# Patient Record
Sex: Female | Born: 1995 | Race: White | Hispanic: Yes | Smoking: Light tobacco smoker
Health system: Southern US, Community
[De-identification: ages and names within clinical notes are randomized; demographics above are authoritative.]

## PROBLEM LIST (undated history)

## (undated) DIAGNOSIS — F419 Anxiety disorder, unspecified: Secondary | ICD-10-CM

## (undated) DIAGNOSIS — F3181 Bipolar II disorder: Secondary | ICD-10-CM

## (undated) DIAGNOSIS — F32A Depression, unspecified: Secondary | ICD-10-CM

## (undated) HISTORY — PX: ANKLE ARTHROSCOPY WITH RECONSTRUCTION: SHX5583

---

## 2016-08-16 ENCOUNTER — Emergency Department (HOSPITAL_COMMUNITY)
Admission: EM | Admit: 2016-08-16 | Discharge: 2016-08-17 | Disposition: A | Discharge status: Home / Self Care | Payer: BLUE CROSS/BLUE SHIELD | Attending: Emergency Medicine | Admitting: Emergency Medicine

## 2016-08-16 ENCOUNTER — Encounter (HOSPITAL_COMMUNITY): Payer: Self-pay | Admitting: Emergency Medicine

## 2016-08-16 DIAGNOSIS — F17200 Nicotine dependence, unspecified, uncomplicated: Secondary | ICD-10-CM | POA: Diagnosis not present

## 2016-08-16 DIAGNOSIS — N921 Excessive and frequent menstruation with irregular cycle: Secondary | ICD-10-CM | POA: Diagnosis not present

## 2016-08-16 DIAGNOSIS — N938 Other specified abnormal uterine and vaginal bleeding: Secondary | ICD-10-CM | POA: Diagnosis present

## 2016-08-16 LAB — CBC WITH DIFFERENTIAL/PLATELET
BASOS ABS: 0.1 10*3/uL (ref 0.0–0.1)
Basophils Relative: 1 %
EOS ABS: 0.7 10*3/uL (ref 0.0–0.7)
EOS PCT: 6 %
HEMATOCRIT: 37.4 % (ref 36.0–46.0)
Hemoglobin: 12.3 g/dL (ref 12.0–15.0)
Lymphocytes Relative: 34 %
Lymphs Abs: 3.5 10*3/uL (ref 0.7–4.0)
MCH: 27.7 pg (ref 26.0–34.0)
MCHC: 32.9 g/dL (ref 30.0–36.0)
MCV: 84.2 fL (ref 78.0–100.0)
MONO ABS: 0.6 10*3/uL (ref 0.1–1.0)
MONOS PCT: 6 %
Neutro Abs: 5.5 10*3/uL (ref 1.7–7.7)
Neutrophils Relative %: 53 %
PLATELETS: 303 10*3/uL (ref 150–400)
RBC: 4.44 MIL/uL (ref 3.87–5.11)
RDW: 13.5 % (ref 11.5–15.5)
WBC: 10.3 10*3/uL (ref 4.0–10.5)

## 2016-08-16 LAB — I-STAT CHEM 8, ED
BUN: 16 mg/dL (ref 6–20)
CALCIUM ION: 1.17 mmol/L (ref 1.15–1.40)
CREATININE: 0.8 mg/dL (ref 0.44–1.00)
Chloride: 103 mmol/L (ref 101–111)
GLUCOSE: 89 mg/dL (ref 65–99)
HCT: 37 % (ref 36.0–46.0)
HEMOGLOBIN: 12.6 g/dL (ref 12.0–15.0)
Potassium: 3.6 mmol/L (ref 3.5–5.1)
Sodium: 141 mmol/L (ref 135–145)
TCO2: 26 mmol/L (ref 0–100)

## 2016-08-16 LAB — SAMPLE TO BLOOD BANK

## 2016-08-16 LAB — POC URINE PREG, ED: Preg Test, Ur: NEGATIVE

## 2016-08-16 NOTE — ED Triage Notes (Signed)
Pt presents with heavy menstrual bleeding with clots and abd cramping; pt states irregular periods anyway but never this heavy; pt states she is having to wear a diaper; pt denies lightheadedness or vaginal discharge, only bleeding;

## 2016-08-16 NOTE — ED Provider Notes (Signed)
[facility]-EMERGENCY DEPT Provider Note   CSN: [id number] Arrival date & time: 08/16/16  2040     History   Chief Complaint Chief Complaint  Patient presents with  . Vaginal Bleeding     HPI  Blood pressure 124/79, pulse 104, temperature 98.4 F (36.9 C), temperature source Oral, resp. rate 20, height 5\' 4"  (1.626 m), weight 106.6 kg, last menstrual period 08/12/2016, SpO2 100 %.  Susan Nash is a 21 y.o. female complaining of heavy menstrual bleeding onset 5 days ago with passage of large clots. She also reports a lower abdominal pain which is alleviated with ibuprofen. She states that her periods have been irregular for 5 years sometimes she only has to periods any year. She does not have an OB/GYN and has never had a pelvic exam before. She denies any abnormal vaginal discharge other than the clots. She's never required a transfusion. She denies chest pain, shortness of breath, palpitations or syncope. She is sexually active. She states that she is using a diaper because the bleeding is so heavy, she states that she's changes at approximately twice a day.  History reviewed. No pertinent past medical history.  There are no active problems to display for this patient.   Past Surgical History:  Procedure Laterality Date  . ANKLE ARTHROSCOPY WITH RECONSTRUCTION      OB History    No data available       Home Medications    Prior to Admission medications   Not on File    Family History History reviewed. No pertinent family history.  Social History Social History  Substance Use Topics  . Smoking status: Light Tobacco Smoker  . Smokeless tobacco: Never Used  . Alcohol use No     Comment: rare     Allergies   Amoxicillin; Gluten meal; and Lactose intolerance (gi)   Review of Systems Review of Systems  10 systems reviewed and found to be negative, except as noted in the HPI.   Physical Exam Updated Vital Signs BP 104/66   Pulse 98   Temp 98.4 F (36.9  C) (Oral)   Resp 20   Ht 5\' 4"  (1.626 m)   Wt 106.6 kg   LMP 08/12/2016   SpO2 100%   BMI 40.34 kg/m   Physical Exam  Constitutional: She is oriented to person, place, and time. She appears well-developed and well-nourished. No distress.  HENT:  Head: Normocephalic and atraumatic.  Mouth/Throat: Oropharynx is clear and moist.  Eyes: Conjunctivae and EOM are normal. Pupils are equal, round, and reactive to light.  Neck: Normal range of motion.  Cardiovascular: Normal rate, regular rhythm and intact distal pulses.   Pulmonary/Chest: Effort normal and breath sounds normal.  Abdominal: Soft. There is no tenderness.  Pelvic exam a chaperoned by technician: No rashes or lesions, dark blood with clots pooled in the posterior fornix, does not recollect after clearing, no cervical motion or adnexal tenderness.  Musculoskeletal: Normal range of motion.  Neurological: She is alert and oriented to person, place, and time.  Skin: She is not diaphoretic.  Psychiatric: She has a normal mood and affect.  Nursing note and vitals reviewed.    ED Treatments / Results  Labs (all labs ordered are listed, but only abnormal results are displayed) Labs Reviewed  WET PREP, GENITAL  CBC WITH DIFFERENTIAL/PLATELET  BASIC METABOLIC PANEL  RPR  HIV ANTIBODY (ROUTINE TESTING)  POC URINE PREG, ED  I-STAT CHEM 8, ED  SAMPLE TO BLOOD BANK  GC/CHLAMYDIA  PROBE AMP ([place]) NOT AT [facility]    EKG  EKG Interpretation None       Radiology No results found.  Procedures Procedures (including critical care time)  Medications Ordered in ED Medications - No data to display   Initial Impression / Assessment and Plan / ED Course  I have reviewed the triage vital signs and the nursing notes.  Pertinent labs & imaging results that were available during my care of the patient were reviewed by me and considered in my medical decision making (see chart for details).     Vitals:   08/16/16 2047  08/16/16 2330 08/17/16 0000  BP: 124/79 121/85 104/66  Pulse: 104 101 98  Resp: 20    Temp: 98.4 F (36.9 C)    TempSrc: Oral    SpO2: 100% 100% 100%  Weight: 106.6 kg    Height: 5\' 4"  (1.626 m)       Susan Nash is 21 y.o. female presenting with Heavy and irregular menses onset 5 days ago. Abdominal exam is benign, vital signs reassuring. Pelvic exam with clot formation however no brisk bleed. No anemia on CBC and no abnormality on wet prep. Patient is [facility] and we've had an extensive discussion of return precautions. dDx:  Evaluation does not show pathology that would require ongoing emergent intervention or inpatient treatment. Pt is hemodynamically stable and mentating appropriately. Discussed findings and plan with patient/guardian, who agrees with care plan. All questions answered. Return precautions discussed and outpatient follow up given.      Final Clinical Impressions(s) / ED Diagnoses   Final diagnoses:  Menorrhagia with irregular cycle    New Prescriptions New Prescriptions   No medications on file     Wynetta Emery, PA-C 08/17/16 0029    Glynn Octave, MD 08/17/16 [phone]

## 2016-08-17 LAB — BASIC METABOLIC PANEL
ANION GAP: 11 (ref 5–15)
BUN: 13 mg/dL (ref 6–20)
CALCIUM: 9.1 mg/dL (ref 8.9–10.3)
CO2: 25 mmol/L (ref 22–32)
CREATININE: 0.76 mg/dL (ref 0.44–1.00)
Chloride: 101 mmol/L (ref 101–111)
GFR calc Af Amer: >60 mL/min (ref >60)
Glucose, Bld: 91 mg/dL (ref 65–99)
Potassium: 3.5 mmol/L (ref 3.5–5.1)
SODIUM: 137 mmol/L (ref 135–145)

## 2016-08-17 LAB — GC/CHLAMYDIA PROBE AMP (~~LOC~~) NOT AT ARMC
Chlamydia: NEGATIVE
Neisseria Gonorrhea: NEGATIVE

## 2016-08-17 LAB — WET PREP, GENITAL
CLUE CELLS WET PREP: NONE SEEN
SPERM: NONE SEEN
Trich, Wet Prep: NONE SEEN
WBC WET PREP: NONE SEEN
Yeast Wet Prep HPF POC: NONE SEEN

## 2016-08-17 LAB — RPR: RPR: NONREACTIVE

## 2016-08-17 LAB — HIV ANTIBODY (ROUTINE TESTING W REFLEX): HIV SCREEN 4TH GENERATION: NONREACTIVE

## 2016-08-17 NOTE — Discharge Instructions (Signed)
Please follow with your primary care doctor in the next 2 days for a check-up. They must obtain records for further management.  ° °Do not hesitate to return to the Emergency Department for any new, worsening or concerning symptoms.  ° °

## 2019-01-26 ENCOUNTER — Other Ambulatory Visit: Payer: Self-pay

## 2019-01-26 ENCOUNTER — Encounter (HOSPITAL_COMMUNITY): Payer: Self-pay

## 2019-01-26 ENCOUNTER — Emergency Department (HOSPITAL_COMMUNITY)
Admission: EM | Admit: 2019-01-26 | Discharge: 2019-01-27 | Disposition: A | Discharge status: Home / Self Care | Payer: BLUE CROSS/BLUE SHIELD | Attending: Emergency Medicine | Admitting: Emergency Medicine

## 2019-01-26 DIAGNOSIS — S61551A Open bite of right wrist, initial encounter: Secondary | ICD-10-CM | POA: Insufficient documentation

## 2019-01-26 DIAGNOSIS — Y9389 Activity, other specified: Secondary | ICD-10-CM | POA: Insufficient documentation

## 2019-01-26 DIAGNOSIS — Y92009 Unspecified place in unspecified non-institutional (private) residence as the place of occurrence of the external cause: Secondary | ICD-10-CM | POA: Insufficient documentation

## 2019-01-26 DIAGNOSIS — F17200 Nicotine dependence, unspecified, uncomplicated: Secondary | ICD-10-CM | POA: Insufficient documentation

## 2019-01-26 DIAGNOSIS — Z23 Encounter for immunization: Secondary | ICD-10-CM | POA: Insufficient documentation

## 2019-01-26 DIAGNOSIS — S61552A Open bite of left wrist, initial encounter: Secondary | ICD-10-CM | POA: Insufficient documentation

## 2019-01-26 DIAGNOSIS — W540XXA Bitten by dog, initial encounter: Secondary | ICD-10-CM | POA: Insufficient documentation

## 2019-01-26 DIAGNOSIS — Y999 Unspecified external cause status: Secondary | ICD-10-CM | POA: Insufficient documentation

## 2019-01-26 MED ORDER — TETANUS-DIPHTH-ACELL PERTUSSIS 5-2.5-18.5 LF-MCG/0.5 IM SUSP
0.5000 mL | Freq: Once | INTRAMUSCULAR | Status: AC
  Administered 2019-01-26: 0.5 mL via INTRAMUSCULAR
  Filled 2019-01-26: qty 0.5

## 2019-01-26 MED ORDER — AMOXICILLIN-POT CLAVULANATE 875-125 MG PO TABS: 1.0000 | ORAL_TABLET | Freq: Two times a day (BID) | ORAL | 0 refills | Status: DC

## 2019-01-26 NOTE — ED Provider Notes (Signed)
[facility]-EMERGENCY DEPT Provider Note   CSN: [id number] Arrival date & time: 01/26/19  2212    History   Chief Complaint Chief Complaint  Patient presents with  . Animal Bite    HPI [name] is a 23 y.o. female presents to ER for evaluation of dog bite onset immediately prior to arrival. She has wounds to bilateral wrists.  States she knows the dog. She reports associated mild left wrist swelling bruising.  Denies any other injuries from this. Unknown tetanus status. Denies distal tingling, loss of sensation. No interventions. No alleviating factors.      HPI  History reviewed. No pertinent past medical history.  There are no active problems to display for this patient.   Past Surgical History:  Procedure Laterality Date  . ANKLE ARTHROSCOPY WITH RECONSTRUCTION       OB History   No obstetric history on file.      Home Medications    Prior to Admission medications   Medication Sig Start Date End Date Taking? Authorizing Provider  amoxicillin-clavulanate (AUGMENTIN) 875-125 MG tablet Take 1 tablet by mouth every 12 (twelve) hours. 01/26/19   Liberty HandyGibbons, Kalin Kyler J, PA-C    Family History History reviewed. No pertinent family history.  Social History Social History   Tobacco Use  . Smoking status: Light Tobacco Smoker  . Smokeless tobacco: Never Used  Substance Use Topics  . Alcohol use: No    Comment: rare  . Drug use: No     Allergies   Amoxicillin, Gluten meal, and Lactose intolerance (gi)   Review of Systems Review of Systems  Skin: Positive for wound.  All other systems reviewed and are negative.    Physical Exam Updated Vital Signs BP (!) 142/82 (BP Location: Left Arm)   Pulse (!) 118   Temp 98 F (36.7 C) (Oral)   Resp 18   SpO2 100%   Physical Exam Constitutional:      Appearance: She is well-developed.  HENT:     Head: Normocephalic.     Nose: Nose normal.  Eyes:     General: Lids are normal.  Neck:      Musculoskeletal: Normal range of motion.  Cardiovascular:     Rate and Rhythm: Normal rate.  Pulmonary:     Effort: Pulmonary effort is normal. No respiratory distress.  Musculoskeletal: Normal range of motion.     Comments: No focal bony tenderness to wrist bones bilaterally. No scaphoid tenderness. Negative scaphoid compression test bilaterally.  Skin:    Comments: 1 cm minimally gaping laceration to ulnar aspect of LEFT wrist with local ecchymosis, small abrasions noted Multiple but small abrasions to right wrist palmar/ventral   Neurological:     Mental Status: She is alert.  Psychiatric:        Behavior: Behavior normal.      ED Treatments / Results  Labs (all labs ordered are listed, but only abnormal results are displayed) Labs Reviewed - No data to display  EKG None  Radiology No results found.  Procedures Procedures (including critical care time)  Medications Ordered in ED Medications  Tdap (BOOSTRIX) injection 0.5 mL (0.5 mLs Intramuscular Given 01/26/19 2358)     Initial Impression / Assessment and Plan / ED Course  I have reviewed the triage vital signs and the nursing notes.  Pertinent labs & imaging results that were available during my care of the patient were reviewed by me and considered in my medical decision making (see chart for  details).  40 yo here with dog bites to bilateral wrists.  Superficial abrasions on right wrist and 1 cm laceration to left wrist. Wounds thoroughly irrigated here. No foreign bodies palpated or seen. Given shallow wounds, benign exam I don't think we need emergent imaging. I doubt bone fracture, dislocation or foreign body retention.  No indication for laceration repair/sutures. She has full ROM of wrists without significant pain.  No scaphoid tenderness. Tetanus updated.  She deferred rabies vaccine series today and will f/u with dog owner to determine rabies status.  Dc with augmentin, wound care instructions. Return  precautions given.   Final Clinical Impressions(s) / ED Diagnoses   Final diagnoses:  Dog bite, initial encounter    ED Discharge Orders         Ordered    amoxicillin-clavulanate (AUGMENTIN) 875-125 MG tablet  Every 12 hours     01/26/19 2359           Kinnie Feil, PA-C 01/27/19 0006    Pattricia Boss, MD 01/27/19 [phone]

## 2019-01-26 NOTE — ED Triage Notes (Signed)
Pt presents with a dog bit to her L wrist. States that it was her friend's dog, but the dog did not have shots. Approx 2 cm laceration noted. Bleeding controlled.

## 2019-01-27 MED FILL — METRONIDAZOLE 500 MG TABS: 500 | 7 days supply | Qty: 21 | Fill #0

## 2019-01-27 MED FILL — DOXYCYCLINE HYCLATE 100 MG: 100 | 7 days supply | Qty: 14 | Fill #0

## 2019-01-27 NOTE — Discharge Instructions (Signed)
You were seen in the ER for dog bite  Wounds were cleaned today  No indication for x-ray  Tetanus was given  You declined starting rabies vaccines today. Please follow up with owner of dog to determine rabies status.  Return to ER or health department if you need rabies immunizations  Take antibiotic as prescribed  Alternate ibuprofen and acetaminophen every 8 hours for pain. Ice. Elevate   Wound will slowly close on its own. We do not put stitches in dog bites due to high risk of infection

## 2019-01-27 NOTE — ED Notes (Signed)
Pt declined d/c vitals. Ambulatory, in no distress, and agreeable to d/c on departure.

## 2019-01-27 NOTE — ED Notes (Signed)
Pt request prescription change related to allergy to amoxicillin. This Probation officer called in doxycycline 100 mg BID x7 days and flagyl 500 mg TID x7 days prescribed by Pearlie Oyster PA to [facility]. 1423 01/27/19

## 2019-05-26 ENCOUNTER — Other Ambulatory Visit: Payer: Self-pay

## 2019-05-26 DIAGNOSIS — Z20822 Contact with and (suspected) exposure to covid-19: Secondary | ICD-10-CM

## 2019-05-28 LAB — NOVEL CORONAVIRUS, NAA: SARS-CoV-2, NAA: NOT DETECTED

## 2019-12-18 ENCOUNTER — Ambulatory Visit (INDEPENDENT_AMBULATORY_CARE_PROVIDER_SITE_OTHER): Payer: 59 | Admitting: Psychology

## 2019-12-18 DIAGNOSIS — F331 Major depressive disorder, recurrent, moderate: Secondary | ICD-10-CM

## 2019-12-18 DIAGNOSIS — F411 Generalized anxiety disorder: Secondary | ICD-10-CM

## 2020-01-08 ENCOUNTER — Ambulatory Visit (INDEPENDENT_AMBULATORY_CARE_PROVIDER_SITE_OTHER): Payer: 59 | Admitting: Psychology

## 2020-01-08 DIAGNOSIS — F331 Major depressive disorder, recurrent, moderate: Secondary | ICD-10-CM | POA: Diagnosis not present

## 2020-01-08 DIAGNOSIS — F411 Generalized anxiety disorder: Secondary | ICD-10-CM

## 2020-01-14 ENCOUNTER — Ambulatory Visit: Payer: 59 | Attending: Internal Medicine

## 2020-01-14 DIAGNOSIS — Z20822 Contact with and (suspected) exposure to covid-19: Secondary | ICD-10-CM | POA: Insufficient documentation

## 2020-01-15 ENCOUNTER — Ambulatory Visit (INDEPENDENT_AMBULATORY_CARE_PROVIDER_SITE_OTHER): Payer: 59 | Admitting: Psychology

## 2020-01-15 DIAGNOSIS — F411 Generalized anxiety disorder: Secondary | ICD-10-CM | POA: Diagnosis not present

## 2020-01-15 DIAGNOSIS — F331 Major depressive disorder, recurrent, moderate: Secondary | ICD-10-CM | POA: Diagnosis not present

## 2020-01-15 LAB — SARS-COV-2, NAA 2 DAY TAT

## 2020-01-15 LAB — NOVEL CORONAVIRUS, NAA: SARS-CoV-2, NAA: NOT DETECTED

## 2020-02-01 ENCOUNTER — Emergency Department (HOSPITAL_BASED_OUTPATIENT_CLINIC_OR_DEPARTMENT_OTHER): Payer: 59

## 2020-02-01 ENCOUNTER — Emergency Department (HOSPITAL_BASED_OUTPATIENT_CLINIC_OR_DEPARTMENT_OTHER)
Admission: EM | Admit: 2020-02-01 | Discharge: 2020-02-01 | Disposition: A | Discharge status: Home / Self Care | Payer: 59 | Attending: Emergency Medicine | Admitting: Emergency Medicine

## 2020-02-01 ENCOUNTER — Emergency Department (HOSPITAL_COMMUNITY)
Admission: EM | Admit: 2020-02-01 | Discharge: 2020-02-01 | Disposition: A | Discharge status: Home / Self Care | Payer: 59 | Attending: Emergency Medicine | Admitting: Emergency Medicine

## 2020-02-01 ENCOUNTER — Encounter (HOSPITAL_BASED_OUTPATIENT_CLINIC_OR_DEPARTMENT_OTHER): Payer: Self-pay

## 2020-02-01 ENCOUNTER — Encounter (HOSPITAL_COMMUNITY): Payer: Self-pay

## 2020-02-01 ENCOUNTER — Other Ambulatory Visit: Payer: Self-pay

## 2020-02-01 DIAGNOSIS — S61451A Open bite of right hand, initial encounter: Secondary | ICD-10-CM

## 2020-02-01 DIAGNOSIS — Y929 Unspecified place or not applicable: Secondary | ICD-10-CM | POA: Insufficient documentation

## 2020-02-01 DIAGNOSIS — Y999 Unspecified external cause status: Secondary | ICD-10-CM | POA: Insufficient documentation

## 2020-02-01 DIAGNOSIS — Y939 Activity, unspecified: Secondary | ICD-10-CM | POA: Diagnosis not present

## 2020-02-01 DIAGNOSIS — S61234A Puncture wound without foreign body of right ring finger without damage to nail, initial encounter: Secondary | ICD-10-CM | POA: Diagnosis not present

## 2020-02-01 DIAGNOSIS — Z5321 Procedure and treatment not carried out due to patient leaving prior to being seen by health care provider: Secondary | ICD-10-CM | POA: Insufficient documentation

## 2020-02-01 DIAGNOSIS — F17200 Nicotine dependence, unspecified, uncomplicated: Secondary | ICD-10-CM | POA: Insufficient documentation

## 2020-02-01 DIAGNOSIS — W540XXA Bitten by dog, initial encounter: Secondary | ICD-10-CM | POA: Insufficient documentation

## 2020-02-01 DIAGNOSIS — S61254A Open bite of right ring finger without damage to nail, initial encounter: Secondary | ICD-10-CM | POA: Insufficient documentation

## 2020-02-01 MED ORDER — AMOXICILLIN-POT CLAVULANATE 875-125 MG PO TABS: 1.0000 | ORAL_TABLET | Freq: Two times a day (BID) | ORAL | 0 refills | Status: DC

## 2020-02-01 MED ORDER — IBUPROFEN 400 MG PO TABS
400.0000 mg | ORAL_TABLET | Freq: Once | ORAL | Status: AC | PRN
  Administered 2020-02-01: 400 mg via ORAL
  Filled 2020-02-01: qty 1

## 2020-02-01 MED ORDER — IBUPROFEN 600 MG PO TABS: 600.0000 mg | ORAL_TABLET | Freq: Four times a day (QID) | ORAL | 0 refills | Status: DC | PRN

## 2020-02-01 NOTE — Discharge Instructions (Addendum)
You have been evaluated for your dog bite.  Please clean the wound daily and wear finger splint for protection.  Monitor for any signs of infection.  Take antibiotic as prescribed however if you develop a reaction please discontinue the medication, take Benadryl and come to the ER for further management.  Have your friend monitor the dog for any signs of rabies.  If concern, return for rabies series.

## 2020-02-01 NOTE — ED Triage Notes (Signed)
Pt presents with c/o animal bite to the ring finger of her right hand. Pt reports it was a friend's dog, bleeding controlled.

## 2020-02-01 NOTE — ED Provider Notes (Signed)
[facility] EMERGENCY DEPARTMENT Provider Note   CSN: [id number] Arrival date & time: 02/01/20  1630     History Chief Complaint  Patient presents with  . Animal Bite    Susan Nash is a 24 y.o. female.  The history is provided by the patient and medical records. No language interpreter was used.  Animal Bite Associated symptoms: no fever and no numbness      24 year old obese female presenting for evaluation of dog bite.  Patient states today she was playing with a friend's dog and while she was petting his tail the dog bit her on her R ring finger.  Report acute onset of sharp throbbing pain to the affected bite.  Pain is nonradiating without any associated numbness.  She did try to rinse the wound and decided to come to the ER.  She is unsure of the vaccination status of the dog but states that the dog is mostly an inside dog and her friend will be monitoring the dog for any concerning changes.  She is right-hand dominant.  She is up-to-date with tetanus.  She rates the pain a 6 out of 10, nonradiating, throbbing.  History reviewed. No pertinent past medical history.  There are no problems to display for this patient.   Past Surgical History:  Procedure Laterality Date  . ANKLE ARTHROSCOPY WITH RECONSTRUCTION       OB History   No obstetric history on file.     No family history on file.  Social History   Tobacco Use  . Smoking status: Light Tobacco Smoker  . Smokeless tobacco: Never Used  Substance Use Topics  . Alcohol use: No    Comment: rare  . Drug use: No    Home Medications Prior to Admission medications   Medication Sig Start Date End Date Taking? Authorizing Provider  amoxicillin-clavulanate (AUGMENTIN) 875-125 MG tablet Take 1 tablet by mouth every 12 (twelve) hours. 01/26/19   Liberty Handy, PA-C    Allergies    Amoxicillin, Gluten meal, and Lactose intolerance (gi)  Review of Systems   Review of Systems  Constitutional:  Negative for fever.  Skin: Positive for wound.  Neurological: Negative for numbness.    Physical Exam Updated Vital Signs BP 120/85 (BP Location: Left Arm)   Pulse (!) 101   Temp 98.9 F (37.2 C) (Oral)   Resp 20   Ht 5\' 4"  (1.626 m)   Wt 99.8 kg   LMP 01/22/2020 (Approximate)   SpO2 97%   BMI 37.76 kg/m   Physical Exam Vitals and nursing note reviewed.  Constitutional:      General: She is not in acute distress.    Appearance: She is well-developed.  HENT:     Head: Atraumatic.  Eyes:     Conjunctiva/sclera: Conjunctivae normal.  Musculoskeletal:        General: Signs of injury (R Ring finger: 3 mm puncture wound noted to the pad of the finger as well as the proximal aspect of the nailbed with 30% subungual hematoma noted.  No joint involvement.  No foreign body noted.  Brisk cap refill distally.) present.     Cervical back: Neck supple.  Skin:    Findings: No rash.  Neurological:     Mental Status: She is alert. Mental status is at baseline.     ED Results / Procedures / Treatments   Labs (all labs ordered are listed, but only abnormal results are displayed) Labs Reviewed - No data to  display  EKG None  Radiology DG Finger Ring Right  Result Date: 02/01/2020 CLINICAL DATA:  Dog bite to the right ring finger EXAM: RIGHT RING FINGER 2+V COMPARISON:  None. FINDINGS: There is no evidence of fracture or dislocation. There is no evidence of arthropathy or other focal bone abnormality. Soft tissues are unremarkable. IMPRESSION: Negative. Electronically Signed   By: Elige Ko   On: 02/01/2020 17:16    Procedures Procedures (including critical care time)  Medications Ordered in ED Medications  ibuprofen (ADVIL) tablet 400 mg (400 mg Oral Given 02/01/20 1654)    ED Course  I have reviewed the triage vital signs and the nursing notes.  Pertinent labs & imaging results that were available during my care of the patient were reviewed by me and considered in my  medical decision making (see chart for details).    MDM Rules/Calculators/A&P                          BP 120/85 (BP Location: Left Arm)   Pulse (!) 101   Temp 98.9 F (37.2 C) (Oral)   Resp 20   Ht 5\' 4"  (1.626 m)   Wt 99.8 kg   LMP 01/22/2020 (Approximate)   SpO2 97%   BMI 37.76 kg/m   Final Clinical Impression(s) / ED Diagnoses Final diagnoses:  Dog bite of right hand, initial encounter    Rx / DC Orders ED Discharge Orders    None     5:59 PM Here with bite wound to her right ring finger at the distal pad of finger also with nail involvement.  No foreign body noted.  She is up-to-date with tetanus.  X-ray unremarkable.  I did discuss option of rabies prophylactic but patient prefers close monitoring of the dog that bit her.  This appears to be a provoked bite therefore I have low suspicion for rabies.  Wounds were thoroughly cleaned, finger splint provided for support and protection.  Wound care instruction given, return precaution discussed.  Patient voiced understanding and agrees with plan.  6:07 PM Patient has amoxicillin listed as drug allergies however she reports she may have taking Augmentin last year for dog bite without any side effect.  She mention she developed a rash when she tested positive for mono but was prescribed amoxicillin..  I agreed to prescribe Augmentin however I encourage patient to monitor her symptoms closely and return if she develop any side effects.  Return precaution discussed.   03/23/2020, PA-C 02/01/20 1809    02/03/20, MD 02/01/20 [phone]

## 2020-02-01 NOTE — ED Triage Notes (Addendum)
Pt presents to the ED with laceration to the R ring finger from a dog bite. Pt states it was a friends dog, but is unknown on vaccination status.

## 2020-02-03 NOTE — Progress Notes (Signed)
 Subjective   HPI:  Susan Nash is a 24 y.o.  female who presents to the office with:  Chief Complaint  Patient presents with  . Annual Exam    Not fasting. Pap Smear. Declined STD screen   . Immunizations    Covid vaccine incomplete    Annual Physical Exam:  Patient presents today for further annual physical exam.   They have a past medical history that is significant for:  Past Medical History:  Diagnosis Date  . Anxiety   . Depression   .    They currently take:  No current outpatient medications on file..   Patient's health maintenance topics include Vaccinations and PAP smear.  They are not up-to-date. Counseled on COVID vaccines today. PAP today  Family history: HTN, no other known history. Breast cancer in Grandmother - no first degree relatives Social history: Tobacco and marijuana use -heavy, vaping Surgical history: ankle fusion  Acute Concerns:   SOB: Patient was seen roughly 2 weeks ago for symptoms of shortness of breath.  Patient reports that they have mildly improved.  Patient did not obtain chest x-ray and she did not obtain albuterol inhaler.  Patient reports that she has decreased the amount of marijuana blunts she is smoking.  Patient still has some concerns for lung cancer.  Patient I discussed symptoms and risk factors.  Patient has been smoking for roughly 2 years.  No B symptoms.  Aware that we are unable to completely rule this out without imaging.  Patient with low risk for tuberculosis.  No other signs or symptoms of DVT, PE.  Basic labs previously unremarkable.  Patient did not obtain chest x-ray due to monetary concerns.    ROS: Negative for constitutional, CV, Resp, Abd/GU symptoms except as noted above in HPI.  PMH: Past medical history, Past surgical history, Social history, family history were reviewed as noted in EMR.  Pertinent for:  Past Medical History:  Diagnosis Date  . Anxiety   . Depression      Health Maintenance  Due  Topic Date Due  . Pap Smear  Never done  . Pneumococcal Vaccine: Pediatrics and At Risk Patients (1 of 2 - PPSV23) Never done  . HPV Vaccine (1 - 2-dose series) Never done  . COVID-19 Vaccine (1) Never done    Medications and allergies reviewed.   Objective    Physical Exam:  Vital Signs: BP 129/86 (BP Location: Left arm, Patient Position: Sitting)   Pulse 116   Temp 98.5 F (36.9 C) (Temporal)   Resp 18   Ht 5' 3.5 (1.613 m)   Wt 222 lb (100.7 kg)   LMP 01/15/2020   BMI 38.71 kg/m   Wt Readings from Last 3 Encounters:  02/03/20 222 lb (100.7 kg)  01/20/20 222 lb 9.6 oz (101 kg)   General:  Well-developed, well-nourished female in no acute distress. HEENT: Head- [place]/AT.  Eyes- EOMI.  PERRLA.  Anicteric.  Nose- non edematous turbinates bilaterally.  O-P- clear without tonsillar hypertrophy or exudate.  Uvula is midline. Neck: supple without thyromegaly, lymphadenopathy or bruits. Heart:  regular rate and rhythm without murmurs, rubs or gallops. Lungs: CTA x 2. Chest wall: benign. Abdomen: soft, non-tender, without HSM or masses.  Positive bowel sounds.  No bruits. Musculoskeletal exam: moves all 4 extremities well. Neuro: Cranial Nerves II- XII intact. Motor strength 5/5 upper and lower extremities and symmetrical. Sensation is intact. Bicep, tricep, patellar, achilles reflexes are 2+, equal and symmetric.   Derm:  warm, moist Back: full range of motion and non-tender. Extremities: without edema. Pulses: 2+ radial, dorsalis pedis, posterior tibial pulses bilaterally Psych: Normal Mood and Affect, normal speech and makes good eye contact Genital: Labia normal. Vaginal canal normal without lesions. Cervix without lesions, mildly friable. Cervical os visualized on exam. PAP smear performed without complication. Minimal discharge noted.   Assessment/Plan   Orders Placed This Encounter  Procedures  . IGP,Aptima HPV,Age Gdln    1. Annual physical exam   2. Obesity,  Class II, BMI 35-39.9   3. Cervical cancer screening   4. Shortness of breath   5. Metrorrhagia    Annual Physical Exam Health maintenance issues including appropriate cancer screening, healthy diet, exercise, and tobacco avoidance/cessation were discussed with the patient.   Routine labs and screening tests ordered.  Consider daily women's multivitamin for adequate folic acid in reproductive aged woman. Contraception counseling performed - defers COVID vaccine counseling today BMI counseling PAP today. Follow-up annually  4. SOB -improved slightly.  Patient did not obtain chest x-ray and did not pick up albuterol.  Patient continues to smoke marijuana, vape and tobacco.  Counseled on cessation.  Patient appears low risk for lung cancer, though patient is aware of risks of tobacco use on her overall health.  Discussed obtaining chest x-ray, close follow-up.  She states understanding and will follow up with our office if she has any changes or concerns.  Metrorrhagia Discussed possible work-up for PCOS including ultrasound, OB/GYN referral.  Patient defers at this time.  She will contact her insurance regarding cost of ultrasound.  Pap smear today.  Patient declines OCP for contraception therapy.    Follow up in about 1 year (around 02/02/2021), or if symptoms worsen or fail to improve, for CPEs.  No future appointments.  Medications at end of visit today: No current outpatient medications on file.  Patient Care Team: Lannie Gell, PA-C as PCP - General (Physician Assistant)

## 2020-02-09 ENCOUNTER — Ambulatory Visit (INDEPENDENT_AMBULATORY_CARE_PROVIDER_SITE_OTHER): Payer: 59 | Admitting: Psychology

## 2020-02-09 DIAGNOSIS — F411 Generalized anxiety disorder: Secondary | ICD-10-CM | POA: Diagnosis not present

## 2020-02-09 DIAGNOSIS — F331 Major depressive disorder, recurrent, moderate: Secondary | ICD-10-CM

## 2020-02-18 ENCOUNTER — Ambulatory Visit (INDEPENDENT_AMBULATORY_CARE_PROVIDER_SITE_OTHER): Payer: 59 | Admitting: Psychology

## 2020-02-18 DIAGNOSIS — F331 Major depressive disorder, recurrent, moderate: Secondary | ICD-10-CM

## 2020-02-18 DIAGNOSIS — F411 Generalized anxiety disorder: Secondary | ICD-10-CM

## 2020-03-02 ENCOUNTER — Ambulatory Visit (INDEPENDENT_AMBULATORY_CARE_PROVIDER_SITE_OTHER): Payer: 59 | Admitting: Psychology

## 2020-03-02 DIAGNOSIS — F331 Major depressive disorder, recurrent, moderate: Secondary | ICD-10-CM

## 2020-03-02 DIAGNOSIS — F411 Generalized anxiety disorder: Secondary | ICD-10-CM | POA: Diagnosis not present

## 2020-03-15 ENCOUNTER — Ambulatory Visit (INDEPENDENT_AMBULATORY_CARE_PROVIDER_SITE_OTHER): Payer: 59 | Admitting: Psychology

## 2020-03-15 DIAGNOSIS — F411 Generalized anxiety disorder: Secondary | ICD-10-CM | POA: Diagnosis not present

## 2020-03-15 DIAGNOSIS — F331 Major depressive disorder, recurrent, moderate: Secondary | ICD-10-CM | POA: Diagnosis not present

## 2020-03-29 ENCOUNTER — Ambulatory Visit (INDEPENDENT_AMBULATORY_CARE_PROVIDER_SITE_OTHER): Payer: 59 | Admitting: Psychology

## 2020-03-29 DIAGNOSIS — F331 Major depressive disorder, recurrent, moderate: Secondary | ICD-10-CM

## 2020-03-29 DIAGNOSIS — F411 Generalized anxiety disorder: Secondary | ICD-10-CM | POA: Diagnosis not present

## 2020-04-07 NOTE — Progress Notes (Signed)
  [facility] Telephonic Visit NON VIDEO VISIT   Patient ID:  Tanisia Nash is a 24 y.o. (DOB [birth year]) female  Patient has been advised as to the limitations and limited nature of physical exam due to nature of a telephone visit, the possibility of privacy risk in the use of a telephone visit, and that the healthcare provider may recommend visiting a healthcare clinic for in-person care and follow up.  Telephonic Visit Assessment and Plan   1. COVID-19 Assessment & Plan:  Patient is currently on day 4 of Covid symptoms, positive test at a [facility] on 1018, patient will upload results to my chart.  She is now interested in monoclonal antibody treatment and meets criteria due to obesity and tobacco use.  Form faxed to [facility] today.  Instructed patient to call back if is not contacted for scheduling or if symptoms change or worsen        Patient's Medications    as of April 07, 2020  5:22 PM   You have not been prescribed any medications.     Risk, benefits, and alternatives were provided through patient instructions given to the patient during the telephone interaction.  If any worsening symptoms or lack of improvement, the patient will seek immediate medical care.  Telephonic Visit History      Patient presents with  . Confirmed Coronavirus (Covid-19)    Patient presents today to follow-up on positive Covid test.  She would like to consider having monoclonal antibody infusion.  Onset of symptoms reported today as 10/16, started with mild headache, nasal congestion progressed to have a mild cough, loss of taste or smell.  Positive Covid test on Monday at [facility] on 10/18.  Patient will upload test results to MyChart.  Patient denies chest pain, dyspnea, fever.  Patient reports she is a smoker.  She has not had Covid vaccination.  She has not had a prior Covid infection. Reviewed and updated this visit by provider: Tobacco  Allergies   Meds  Problems  Med Hx  Surg Hx  Fam Hx        ROS:  As documented in the history above, all other relevant system complaints were negative.  Telephonic Visit Objective Findings   This is a telephone visit. Examination conducted without the use of video cameras/computer monitors. Vital signs and other aspects of physical exam are limited due to the nature of this encounter.  Confirmed with patient that she does consent to a telephonic visit and is officially scheduled today.  Constitutional:  No apparent acute distress noted during the telephone interaction; Alert and verbally interactive. Mood:  Appears appropriate to situation.

## 2020-04-12 ENCOUNTER — Ambulatory Visit (INDEPENDENT_AMBULATORY_CARE_PROVIDER_SITE_OTHER): Payer: 59 | Admitting: Psychology

## 2020-04-12 DIAGNOSIS — F411 Generalized anxiety disorder: Secondary | ICD-10-CM | POA: Diagnosis not present

## 2020-04-12 DIAGNOSIS — F331 Major depressive disorder, recurrent, moderate: Secondary | ICD-10-CM

## 2020-04-19 ENCOUNTER — Telehealth (INDEPENDENT_AMBULATORY_CARE_PROVIDER_SITE_OTHER): Payer: 59 | Admitting: Psychiatry

## 2020-04-19 ENCOUNTER — Other Ambulatory Visit: Payer: Self-pay

## 2020-04-19 ENCOUNTER — Encounter (HOSPITAL_COMMUNITY): Payer: Self-pay | Admitting: Psychiatry

## 2020-04-19 DIAGNOSIS — F3181 Bipolar II disorder: Secondary | ICD-10-CM | POA: Diagnosis not present

## 2020-04-19 DIAGNOSIS — F419 Anxiety disorder, unspecified: Secondary | ICD-10-CM

## 2020-04-19 MED ORDER — VENLAFAXINE HCL ER 37.5 MG PO CP24: 37.5000 mg | ORAL_CAPSULE | Freq: Every day | ORAL | 1 refills | Status: DC

## 2020-04-19 MED ORDER — LAMOTRIGINE 25 MG PO TABS: ORAL_TABLET | ORAL | 1 refills | Status: DC
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
## 2020-04-27 ENCOUNTER — Ambulatory Visit (INDEPENDENT_AMBULATORY_CARE_PROVIDER_SITE_OTHER): Payer: 59 | Admitting: Psychology

## 2020-04-27 DIAGNOSIS — F411 Generalized anxiety disorder: Secondary | ICD-10-CM | POA: Diagnosis not present

## 2020-04-27 DIAGNOSIS — F331 Major depressive disorder, recurrent, moderate: Secondary | ICD-10-CM | POA: Diagnosis not present

## 2020-05-17 ENCOUNTER — Ambulatory Visit (INDEPENDENT_AMBULATORY_CARE_PROVIDER_SITE_OTHER): Payer: 59 | Admitting: Psychology

## 2020-05-17 DIAGNOSIS — F331 Major depressive disorder, recurrent, moderate: Secondary | ICD-10-CM

## 2020-05-17 DIAGNOSIS — F411 Generalized anxiety disorder: Secondary | ICD-10-CM

## 2020-05-31 ENCOUNTER — Other Ambulatory Visit: Payer: Self-pay

## 2020-05-31 ENCOUNTER — Telehealth (INDEPENDENT_AMBULATORY_CARE_PROVIDER_SITE_OTHER): Payer: 59 | Admitting: Psychiatry

## 2020-05-31 ENCOUNTER — Encounter (HOSPITAL_COMMUNITY): Payer: Self-pay | Admitting: Psychiatry

## 2020-05-31 DIAGNOSIS — F3181 Bipolar II disorder: Secondary | ICD-10-CM

## 2020-05-31 DIAGNOSIS — F419 Anxiety disorder, unspecified: Secondary | ICD-10-CM

## 2020-05-31 MED ORDER — VENLAFAXINE HCL ER 37.5 MG PO CP24: 37.5000 mg | ORAL_CAPSULE | Freq: Every day | ORAL | 1 refills | Status: DC

## 2020-05-31 MED ORDER — LAMOTRIGINE 25 MG PO TABS: ORAL_TABLET | ORAL | 1 refills | Status: DC

## 2020-05-31 NOTE — Progress Notes (Signed)
BH OP Progress Note   Virtual Visit via Telephone Note  I connected with Susan Nash on 05/31/20 at  1:40 PM EST by telephone and verified that I am speaking with the correct person using two identifiers.  Location: Patient: home Provider: Clinic   I discussed the limitations, risks, security and privacy concerns of performing an evaluation and management service by telephone and the availability of in person appointments. I also discussed with the patient that there may be a patient responsible charge related to this service. The patient expressed understanding and agreed to proceed.   I provided 14 minutes of non-face-to-face time during this encounter.   Patient Identification: Susan Nash MRN:  [id number] Date of Evaluation:  05/31/2020    Chief Complaint: " I am feeling a lot less emotional."   Visit Diagnosis:    ICD-10-CM   1. Bipolar 2 disorder (HCC)  F31.81   2. Anxiety  F41.9     History of Present Illness: Patient informed that she is doing a lot better now.  She informed that she just got a new job and she is currently at work so she cannot talk for too long.  She informed that the medicines have been quite helpful.  She informed that she did not feel as nauseous that she did in the past with Effexor.  She also was doing really well with Lamictal however she did feel like her nausea kind of came back after she went up on the dose of Lamictal to 3 tablets daily.   She also mentioned that she felt her energy levels were really good initially when she went up on the dose but now her energy levels are not so great however for the most part she feels good. Based on this feedback, writer suggested that we continue the same regimen at the same dose for now and patient is agreeable to that.   Past Psychiatric History: depression, anxiety  Previous Psychotropic Medications: Yes - Venlafaxine, Sertraline, Hydroxyzine  Substance Abuse History in the last 12 months:   Yes.    Consequences of Substance Abuse: Negative  Past Medical History: History reviewed. No pertinent past medical history.  Past Surgical History:  Procedure Laterality Date   ANKLE ARTHROSCOPY WITH RECONSTRUCTION      Family Psychiatric History: dad- depression, grandmother- anxiety  Family History: History reviewed. No pertinent family history.  Social History:   Social History   Socioeconomic History   Marital status: Significant Other    Spouse name: Not on file   Number of children: Not on file   Years of education: Not on file   Highest education level: Not on file  Occupational History   Not on file  Tobacco Use   Smoking status: Light Tobacco Smoker   Smokeless tobacco: Never Used  Substance and Sexual Activity   Alcohol use: No    Comment: rare   Drug use: No   Sexual activity: Not on file  Other Topics Concern   Not on file  Social History Narrative   Not on file   Social Determinants of Health   Financial Resource Strain: Not on file  Food Insecurity: Not on file  Transportation Needs: Not on file  Physical Activity: Not on file  Stress: Not on file  Social Connections: Not on file    Additional Social History: Works from home, engaged, lives with fiance and her mom  Allergies:   Allergies  Allergen Reactions   Amoxicillin    Gluten Meal  Lactose Intolerance (Gi)     Metabolic Disorder Labs: No results found for: HGBA1C, MPG No results found for: PROLACTIN No results found for: CHOL, TRIG, HDL, CHOLHDL, VLDL, LDLCALC No results found for: TSH  Therapeutic Level Labs: No results found for: LITHIUM No results found for: CBMZ No results found for: VALPROATE  Current Medications: Current Outpatient Medications  Medication Sig Dispense Refill   lamoTRIgine (LAMICTAL) 25 MG tablet Take 1 tab daily for 2 weeks then take 2 tabs daily for 2 weeks then take 3 tabs daily 60 tablet 1   venlafaxine XR (EFFEXOR XR) 37.5 MG  24 hr capsule Take 1 capsule (37.5 mg total) by mouth daily with breakfast. 30 capsule 1   No current facility-administered medications for this visit.     Psychiatric Specialty Exam: Review of Systems  There were no vitals taken for this visit.There is no height or weight on file to calculate BMI.  General Appearance:unable to assess due to phone visit  Eye Contact:  unable to assess due to phone visit  Speech:  Clear and Coherent and Normal Rate  Volume:  Normal  Mood:  Euthymic  Affect:  Congruent  Thought Process:  Goal Directed and Descriptions of Associations: Circumstantial  Orientation:  Full (Time, Place, and Person)  Thought Content:  Logical  Suicidal Thoughts:  No  Homicidal Thoughts:  No  Memory:  Immediate;   Good Recent;   Good  Judgement:  Fair  Insight:  Fair  Psychomotor Activity:  Normal  Concentration:  Concentration: Good and Attention Span: Good  Recall:  Good  Fund of Knowledge:Good  Language: Good  Akathisia:  Negative  Handed:  Right  AIMS (if indicated):  not done  Assets:  Communication Skills Desire for Improvement Financial Resources/Insurance Housing  ADL's:  Intact  Cognition: WNL  Sleep:  Fair      Assessment and Plan: Patient seems to be doing better in terms of her mood after being started on the combination of Effexor and Lamictal.  She did report some nausea with the current dose of Lamictal however is willing to continue it to see if that side effect goes away with time.  1. Bipolar 2 disorder (HCC)  - lamoTRIgine (LAMICTAL) 25 MG tablet; Take 3 tabs daily  Dispense: 90 tablet; Refill: 1 - venlafaxine XR (EFFEXOR XR) 37.5 MG 24 hr capsule; Take 1 capsule (37.5 mg total) by mouth daily with breakfast.  Dispense: 30 capsule; Refill: 1  2. Anxiety  - venlafaxine XR (EFFEXOR XR) 37.5 MG 24 hr capsule; Take 1 capsule (37.5 mg total) by mouth daily with breakfast.  Dispense: 30 capsule; Refill: 1   F/up in 2 months. Continue  individual therapy with Ms. Adella Hare.  Zena Amos, MD 12/13/20211:49 PM

## 2020-06-01 ENCOUNTER — Ambulatory Visit (INDEPENDENT_AMBULATORY_CARE_PROVIDER_SITE_OTHER): Payer: 59 | Admitting: Psychology

## 2020-06-01 DIAGNOSIS — F331 Major depressive disorder, recurrent, moderate: Secondary | ICD-10-CM | POA: Diagnosis not present

## 2020-06-01 DIAGNOSIS — F411 Generalized anxiety disorder: Secondary | ICD-10-CM

## 2020-06-22 ENCOUNTER — Ambulatory Visit (INDEPENDENT_AMBULATORY_CARE_PROVIDER_SITE_OTHER): Payer: 59 | Admitting: Psychology

## 2020-06-22 DIAGNOSIS — F411 Generalized anxiety disorder: Secondary | ICD-10-CM | POA: Diagnosis not present

## 2020-06-22 DIAGNOSIS — F331 Major depressive disorder, recurrent, moderate: Secondary | ICD-10-CM | POA: Diagnosis not present

## 2020-07-05 ENCOUNTER — Ambulatory Visit (INDEPENDENT_AMBULATORY_CARE_PROVIDER_SITE_OTHER): Payer: 59 | Admitting: Psychology

## 2020-07-05 DIAGNOSIS — F331 Major depressive disorder, recurrent, moderate: Secondary | ICD-10-CM | POA: Diagnosis not present

## 2020-07-05 DIAGNOSIS — F411 Generalized anxiety disorder: Secondary | ICD-10-CM | POA: Diagnosis not present

## 2020-07-19 ENCOUNTER — Ambulatory Visit (INDEPENDENT_AMBULATORY_CARE_PROVIDER_SITE_OTHER): Payer: 59 | Admitting: Psychology

## 2020-07-19 DIAGNOSIS — F331 Major depressive disorder, recurrent, moderate: Secondary | ICD-10-CM

## 2020-07-20 ENCOUNTER — Telehealth (HOSPITAL_COMMUNITY): Payer: Self-pay | Admitting: Psychiatry

## 2020-07-20 DIAGNOSIS — F3181 Bipolar II disorder: Secondary | ICD-10-CM

## 2020-07-20 DIAGNOSIS — F419 Anxiety disorder, unspecified: Secondary | ICD-10-CM

## 2020-07-20 MED ORDER — LAMOTRIGINE 25 MG PO TABS: ORAL_TABLET | ORAL | 1 refills | Status: DC

## 2020-07-20 MED ORDER — VENLAFAXINE HCL ER 37.5 MG PO CP24: 37.5000 mg | ORAL_CAPSULE | Freq: Every day | ORAL | 1 refills | Status: DC

## 2020-07-20 NOTE — Telephone Encounter (Signed)
Patient appointment rescheduled due to writer's schedule conflict.  Prescription was sent to the pharmacy so that patient does not run out of her medications.

## 2020-07-28 ENCOUNTER — Telehealth (HOSPITAL_COMMUNITY): Payer: 59 | Admitting: Psychiatry

## 2020-08-02 ENCOUNTER — Ambulatory Visit (INDEPENDENT_AMBULATORY_CARE_PROVIDER_SITE_OTHER): Payer: 59 | Admitting: Psychology

## 2020-08-02 DIAGNOSIS — F331 Major depressive disorder, recurrent, moderate: Secondary | ICD-10-CM

## 2020-08-02 DIAGNOSIS — F411 Generalized anxiety disorder: Secondary | ICD-10-CM

## 2020-08-16 ENCOUNTER — Ambulatory Visit (INDEPENDENT_AMBULATORY_CARE_PROVIDER_SITE_OTHER): Payer: 59 | Admitting: Psychology

## 2020-08-16 DIAGNOSIS — F411 Generalized anxiety disorder: Secondary | ICD-10-CM

## 2020-08-16 DIAGNOSIS — F331 Major depressive disorder, recurrent, moderate: Secondary | ICD-10-CM | POA: Diagnosis not present

## 2020-08-19 ENCOUNTER — Encounter (HOSPITAL_COMMUNITY): Payer: Self-pay | Admitting: Psychiatry

## 2020-08-19 ENCOUNTER — Telehealth (INDEPENDENT_AMBULATORY_CARE_PROVIDER_SITE_OTHER): Payer: 59 | Admitting: Psychiatry

## 2020-08-19 ENCOUNTER — Other Ambulatory Visit: Payer: Self-pay

## 2020-08-19 DIAGNOSIS — F419 Anxiety disorder, unspecified: Secondary | ICD-10-CM | POA: Diagnosis not present

## 2020-08-19 DIAGNOSIS — F3181 Bipolar II disorder: Secondary | ICD-10-CM

## 2020-08-19 MED ORDER — LAMOTRIGINE 25 MG PO TABS: ORAL_TABLET | ORAL | 1 refills | Status: DC

## 2020-08-19 MED ORDER — VENLAFAXINE HCL ER 37.5 MG PO CP24: 37.5000 mg | ORAL_CAPSULE | Freq: Every day | ORAL | 1 refills | Status: DC

## 2020-08-19 NOTE — Progress Notes (Signed)
BH OP Progress Note   Virtual Visit via Video Note  I connected with Susan Nash on 08/19/20 at 11:20 AM EST by a video enabled telemedicine application and verified that I am speaking with the correct person using two identifiers.  Location: Patient: Home Provider: Clinic   I discussed the limitations of evaluation and management by telemedicine and the availability of in person appointments. The patient expressed understanding and agreed to proceed.  I provided 16 minutes of non-face-to-face time during this encounter.     Patient Identification: Susan Nash MRN:  [id number] Date of Evaluation:  08/19/2020    Chief Complaint: " I am feeling a lot less emotional."   Visit Diagnosis:    ICD-10-CM   1. Bipolar 2 disorder (HCC)  F31.81 venlafaxine XR (EFFEXOR XR) 37.5 MG 24 hr capsule    lamoTRIgine (LAMICTAL) 25 MG tablet  2. Anxiety  F41.9 venlafaxine XR (EFFEXOR XR) 37.5 MG 24 hr capsule    History of Present Illness: Patient reported that overall she is doing well.  She informed that for some reason the pharmacy did not fill her prescription for Lamictal last month and therefore she is only been taking Effexor.  She denied any recent hypomanic or manic symptoms. She reported that she has been sleeping well.  Her anxiety has been fairly well managed. She stated that she does suffer from procrastination and not being able to make decisions.  She gave an example of how she takes a long time to decide what she is going to wear or what she is going to order to eat. She does not seem to have any difficulty making major life decisions. She stated that she worries about what will go wrong or what can potentially happen if she makes 1 choice. She was advised to discuss this with her therapist when she sees her next.  Writer explained to her that in order to prevent any hypomanic or manic episodes she needs to have a mood stabilizer on board.  She was informed that Clinical research associate will be  sending another prescription for Lamictal and that she needs to start it back up with 2 weekly titration for optimal mood stabilization.  Patient was agreeable to this recommendation and verbalized her understanding.  Past Psychiatric History: depression, anxiety  Previous Psychotropic Medications: Yes - Venlafaxine, Sertraline, Hydroxyzine  Substance Abuse History in the last 12 months:  Yes.    Consequences of Substance Abuse: Negative  Past Medical History: History reviewed. No pertinent past medical history.  Past Surgical History:  Procedure Laterality Date  . ANKLE ARTHROSCOPY WITH RECONSTRUCTION      Family Psychiatric History: dad- depression, grandmother- anxiety  Family History: History reviewed. No pertinent family history.  Social History:   Social History   Socioeconomic History  . Marital status: Significant Other    Spouse name: Not on file  . Number of children: Not on file  . Years of education: Not on file  . Highest education level: Not on file  Occupational History  . Not on file  Tobacco Use  . Smoking status: Light Tobacco Smoker  . Smokeless tobacco: Never Used  Substance and Sexual Activity  . Alcohol use: No    Comment: rare  . Drug use: No  . Sexual activity: Not on file  Other Topics Concern  . Not on file  Social History Narrative  . Not on file   Social Determinants of Health   Financial Resource Strain: Not on file  Food Insecurity:  Not on file  Transportation Needs: Not on file  Physical Activity: Not on file  Stress: Not on file  Social Connections: Not on file    Additional Social History: Works from home, engaged, lives with fiance and her mom  Allergies:   Allergies  Allergen Reactions  . Amoxicillin   . Gluten Meal   . Lactose Intolerance (Gi)     Metabolic Disorder Labs: No results found for: HGBA1C, MPG No results found for: PROLACTIN No results found for: CHOL, TRIG, HDL, CHOLHDL, VLDL, LDLCALC No results  found for: TSH  Therapeutic Level Labs: No results found for: LITHIUM No results found for: CBMZ No results found for: VALPROATE  Current Medications: Current Outpatient Medications  Medication Sig Dispense Refill  . lamoTRIgine (LAMICTAL) 25 MG tablet Take one tablet daily for 2 weeks then take 2 tablets daily for 2 weeks then take 3 tablets daily 90 tablet 1  . venlafaxine XR (EFFEXOR XR) 37.5 MG 24 hr capsule Take 1 capsule (37.5 mg total) by mouth daily with breakfast. 30 capsule 1   No current facility-administered medications for this visit.     Psychiatric Specialty Exam: Review of Systems  There were no vitals taken for this visit.There is no height or weight on file to calculate BMI.  General Appearance:Fairly groomed  Eye Contact:  Good  Speech:  Clear and Coherent and Normal Rate  Volume:  Normal  Mood:  Euthymic  Affect:  Congruent  Thought Process:  Goal Directed and Descriptions of Associations: Circumstantial  Orientation:  Full (Time, Place, and Person)  Thought Content:  Logical  Suicidal Thoughts:  No  Homicidal Thoughts:  No  Memory:  Immediate;   Good Recent;   Good  Judgement:  Fair  Insight:  Fair  Psychomotor Activity:  Normal  Concentration:  Concentration: Good and Attention Span: Good  Recall:  Good  Fund of Knowledge:Good  Language: Good  Akathisia:  Negative  Handed:  Right  AIMS (if indicated):  not done  Assets:  Communication Skills Desire for Improvement Financial Resources/Insurance Housing  ADL's:  Intact  Cognition: WNL  Sleep:  Fair      Assessment and Plan: Patient informed that for some reason the pharmacy did not fill her prescription for Lamictal last month and therefore she has only been taking Effexor.  She is agreeable to start the gradual titration after being explained that she needs to have a mood stabilizer on board to prevent any future hypomanic or manic episodes.   1. Bipolar 2 disorder (HCC)  - venlafaxine  XR (EFFEXOR XR) 37.5 MG 24 hr capsule; Take 1 capsule (37.5 mg total) by mouth daily with breakfast.  Dispense: 30 capsule; Refill: 1 - lamoTRIgine (LAMICTAL) 25 MG tablet; Take one tablet daily for 2 weeks then take 2 tablets daily for 2 weeks then take 3 tablets daily  Dispense: 90 tablet; Refill: 1  2. Anxiety  - venlafaxine XR (EFFEXOR XR) 37.5 MG 24 hr capsule; Take 1 capsule (37.5 mg total) by mouth daily with breakfast.  Dispense: 30 capsule; Refill: 1   F/up in 2 months. Continue individual therapy with Ms. Adella Hare.  Zena Amos, MD 3/3/202211:33 AM

## 2020-09-06 ENCOUNTER — Ambulatory Visit (INDEPENDENT_AMBULATORY_CARE_PROVIDER_SITE_OTHER): Payer: 59 | Admitting: Psychology

## 2020-09-06 DIAGNOSIS — F331 Major depressive disorder, recurrent, moderate: Secondary | ICD-10-CM

## 2020-09-06 DIAGNOSIS — F411 Generalized anxiety disorder: Secondary | ICD-10-CM | POA: Diagnosis not present

## 2020-09-28 ENCOUNTER — Ambulatory Visit (INDEPENDENT_AMBULATORY_CARE_PROVIDER_SITE_OTHER): Payer: 59 | Admitting: Psychology

## 2020-09-28 DIAGNOSIS — F331 Major depressive disorder, recurrent, moderate: Secondary | ICD-10-CM

## 2020-09-28 DIAGNOSIS — F411 Generalized anxiety disorder: Secondary | ICD-10-CM | POA: Diagnosis not present

## 2020-10-19 ENCOUNTER — Ambulatory Visit (INDEPENDENT_AMBULATORY_CARE_PROVIDER_SITE_OTHER): Payer: 59 | Admitting: Psychology

## 2020-10-19 ENCOUNTER — Other Ambulatory Visit: Payer: Self-pay

## 2020-10-19 ENCOUNTER — Telehealth (HOSPITAL_COMMUNITY): Payer: 59 | Admitting: Psychiatry

## 2020-10-19 DIAGNOSIS — F331 Major depressive disorder, recurrent, moderate: Secondary | ICD-10-CM

## 2020-10-19 DIAGNOSIS — F411 Generalized anxiety disorder: Secondary | ICD-10-CM

## 2020-11-11 ENCOUNTER — Ambulatory Visit (INDEPENDENT_AMBULATORY_CARE_PROVIDER_SITE_OTHER): Payer: 59 | Admitting: Psychology

## 2020-11-11 DIAGNOSIS — F411 Generalized anxiety disorder: Secondary | ICD-10-CM

## 2020-11-11 DIAGNOSIS — F331 Major depressive disorder, recurrent, moderate: Secondary | ICD-10-CM | POA: Diagnosis not present

## 2020-12-15 ENCOUNTER — Ambulatory Visit (INDEPENDENT_AMBULATORY_CARE_PROVIDER_SITE_OTHER): Payer: 59 | Admitting: Psychology

## 2020-12-15 DIAGNOSIS — F411 Generalized anxiety disorder: Secondary | ICD-10-CM

## 2020-12-15 DIAGNOSIS — F331 Major depressive disorder, recurrent, moderate: Secondary | ICD-10-CM | POA: Diagnosis not present

## 2020-12-22 ENCOUNTER — Ambulatory Visit (INDEPENDENT_AMBULATORY_CARE_PROVIDER_SITE_OTHER): Payer: 59 | Admitting: Psychology

## 2020-12-22 DIAGNOSIS — F3181 Bipolar II disorder: Secondary | ICD-10-CM

## 2020-12-29 ENCOUNTER — Ambulatory Visit (INDEPENDENT_AMBULATORY_CARE_PROVIDER_SITE_OTHER): Payer: 59 | Admitting: Psychology

## 2020-12-29 DIAGNOSIS — F3181 Bipolar II disorder: Secondary | ICD-10-CM

## 2021-01-05 ENCOUNTER — Other Ambulatory Visit (HOSPITAL_COMMUNITY): Payer: Self-pay | Admitting: Psychiatry

## 2021-01-05 ENCOUNTER — Telehealth (HOSPITAL_COMMUNITY): Payer: Self-pay | Admitting: Psychiatry

## 2021-01-05 DIAGNOSIS — F3181 Bipolar II disorder: Secondary | ICD-10-CM

## 2021-01-05 DIAGNOSIS — F419 Anxiety disorder, unspecified: Secondary | ICD-10-CM

## 2021-01-05 MED ORDER — VENLAFAXINE HCL ER 37.5 MG PO CP24
37.5000 mg | ORAL_CAPSULE | Freq: Every day | ORAL | 1 refills | Status: DC
Start: 2021-01-05 — End: 2021-01-06

## 2021-01-05 MED ORDER — LAMOTRIGINE 25 MG PO TABS: ORAL_TABLET | ORAL | 1 refills | Status: DC

## 2021-01-05 NOTE — Telephone Encounter (Signed)
Patient called for REFILL:  EFFEXOR XR & LAMOTRIGINE 25 MG Pharmacy :  [facility] Patient phone number: [phone] Last seen:  08/19/20 Evelene Croon COMMENTS:   03/07/21 Appt Doyne Keel

## 2021-01-05 NOTE — Telephone Encounter (Signed)
Medications refilled and sent to preferred pharmacy.

## 2021-01-06 ENCOUNTER — Other Ambulatory Visit (HOSPITAL_COMMUNITY): Payer: Self-pay | Admitting: Psychiatry

## 2021-01-06 ENCOUNTER — Ambulatory Visit (INDEPENDENT_AMBULATORY_CARE_PROVIDER_SITE_OTHER): Payer: 59 | Admitting: Psychology

## 2021-01-06 DIAGNOSIS — F3181 Bipolar II disorder: Secondary | ICD-10-CM

## 2021-01-06 DIAGNOSIS — F419 Anxiety disorder, unspecified: Secondary | ICD-10-CM

## 2021-01-06 MED ORDER — VENLAFAXINE HCL ER 37.5 MG PO CP24
37.5000 mg | ORAL_CAPSULE | Freq: Every day | ORAL | 1 refills | Status: DC
Start: 2021-01-06 — End: 2021-03-07

## 2021-01-06 MED ORDER — LAMOTRIGINE 25 MG PO TABS
ORAL_TABLET | ORAL | 2 refills | Status: DC
Start: 2021-01-06 — End: 2021-03-07

## 2021-01-11 ENCOUNTER — Ambulatory Visit (INDEPENDENT_AMBULATORY_CARE_PROVIDER_SITE_OTHER): Payer: 59 | Admitting: Psychology

## 2021-01-11 DIAGNOSIS — F3181 Bipolar II disorder: Secondary | ICD-10-CM

## 2021-01-19 ENCOUNTER — Ambulatory Visit (INDEPENDENT_AMBULATORY_CARE_PROVIDER_SITE_OTHER): Payer: 59 | Admitting: Psychology

## 2021-01-19 DIAGNOSIS — F3181 Bipolar II disorder: Secondary | ICD-10-CM | POA: Diagnosis not present

## 2021-02-03 ENCOUNTER — Ambulatory Visit: Payer: 59 | Admitting: Psychology

## 2021-02-09 ENCOUNTER — Ambulatory Visit: Payer: 59 | Admitting: Psychology

## 2021-02-16 ENCOUNTER — Ambulatory Visit: Payer: Self-pay | Admitting: Psychology

## 2021-02-22 ENCOUNTER — Ambulatory Visit (INDEPENDENT_AMBULATORY_CARE_PROVIDER_SITE_OTHER): Payer: 59 | Admitting: Psychology

## 2021-02-22 DIAGNOSIS — F3181 Bipolar II disorder: Secondary | ICD-10-CM

## 2021-02-23 NOTE — Progress Notes (Signed)
 Subjective   HPI:  Susan Nash is a 25 y.o.  female who presents to the office with:  Chief Complaint  Patient presents with  . Immunizations    Covid// declined HPV// would like to discuss Pneumonia// declined  . Jaw Pain    Pt states that her right jaw hurts and is causing her whole head to hurt. Right is muffled. Started 2 days ago.   . food allergy    Dairy, pasta and ground beef. Pt states this year she started throwing up after eating. Stomach and mid back pain before throwing up.   . STD    Pt would like to be screened// no symptoms.   2 days.  Right ear pressure  No drainage.  No dental pain.  No nasal congestion, cough fever.  In the past years has had emesis with dairy products and ground beef.  Intermittent loose bowel movements and constipation.   Smtpoms do not completely resolve with food avoidance.  + no reflux, sour taste, hoarse voice.  No dysphagia.  Requests STD screening.  New partner- no known exposres.  No vaginal discharge, pevic pain or bleeding.      PHQ9    Depression Screen 02/23/2021 01/20/2020   Please choose the category that best describes the patient's current state 2 1   Not eligible on the basis of Not applicable Not applicable   1. Little interest or pleasure in doing things 3 3   2. Feeling down, depressed, or hopeless 2 3   PHQ-2 Total Score 5 6   3. Trouble falling or staying asleep 2 2   4. Feeling tired or having little energy 2 3   5. Poor appetite or overeating 3 2   6. Feeling bad about yourself - or that you are a failure or have let yourself or your family down 1 3   7. Trouble concentrating on things, such as reading the newspaper or watching television 2 3   8. Moving or speaking so slowly that other people could have noticed.  Or the opposite - being so fidgety or restless that you have been moving around a lot more than usual. 0 0   9. Thoughts that you would be better off dead, or of hurting yourself in some way.  0 0   10. How difficult have these problems made it for you to do your work, take care of things at home or get along with other people? Somewhat difficult Somewhat difficult   PHQ Total Score 15 19   Interpretation: Moderately Severe Depression, immediate pharmacotherapy and/or counseling recommended Moderately Severe Depression, immediate pharmacotherapy and/or counseling recommended   PHQ-9 Interpretation of Score for Depression (Payor) Positive Positive   Type of intervention recommended: Patient prescribed or currently receiving intervention (treatment/counseling) Referral to PCP for evaluation       ROS: Negative for constitutional, CV, Resp, Abd/GU symptoms except as noted above in HPI.  PMH: Past medical history, Past surgical history, Social history, family history were reviewed as noted in EMR.  Pertinent for:  Past Medical History:  Diagnosis Date  . Anxiety   . COVID-19 04/07/2020  . Depression      Medications and allergies reviewed.   Objective    Physical Exam:  Vital Signs: BP 120/69 (BP Location: Left arm, Patient Position: Sitting)   Pulse 69   Temp 97.9 F (36.6 C) (Temporal)   Resp 18   Ht 5' 3.5 (1.613 m)   Wt 174 lb (78.9  kg)   LMP 02/23/2021   SpO2 98%   BMI 30.34 kg/m   General:  Well appearing, Alert & Oriented CV:  Normal to palpation without thrill.  Regular Rate and Rhythm, No murmurs, rubs, or gallops.   Respiratory:  Normal respiratory effort.  No wheezes or crackles.  Good air movement without diminished sounds.   Abdomen:  Normal bowel sounds.  Non-tender to palpation.  No rebound or guarding.  No palpable liver or spleen. Extremities:  No pretibial edema  Assessment/Plan    Orders Placed This Encounter  Procedures  . CT/NG/TRICH,NAA Genital  . CBC And Differential  . Comprehensive Metabolic Panel  . TSH Rfx on Abnormal to Free T4  . RPR, Rfx Qn RPR/Confirm TP  . HIV p24 Antigen/Antibody With Reflex to Confirmation  . Lipid panel     Screen for STD (sexually transmitted disease)  Asymptomatic.  Recommended condoms always for STD prevention.  Bipolar 2 disorder (*) Stable, under the care of psychiatry.  Has upcoming appointment scheduled for further optimization of medication management   Ear pressure Normal ear exam today without evidence of acute infection or referred pain.  Recommended trial of Flonase and will monitor symptoms.  Follow-up for new or worsening   Bowel habit changes  Patient reports longstanding intermittent loose bowel movements and constipation, intolerance to dairy products and ground beef without clear correlation as she sometimes has the symptoms other times.  I recommended avoidance of dairy and to keep a log of symptoms and to schedule follow-up with PCP in 1 month to better understand current symptoms and triggers  Will check screening labs today  Medications at end of visit today:  Current Outpatient Medications:  .  albuterol sulfate HFA (PROVENTIL HFA) 108 (90 Base) MCG/ACT inhaler, Inhale two puffs into the lungs every 6 (six) hours as needed for Wheezing., Disp: 6 g, Rfl: 3 .  lamoTRIgine  (LAMICTAL ) 25 mg tablet, Take 25 mg by mouth daily. 3 tabs QD, Disp: , Rfl:  .  venlafaxine  HCl (EFFEXOR ) 37.5 mg tablet, Take 37.5 mg by mouth daily., Disp: , Rfl:

## 2021-03-07 ENCOUNTER — Ambulatory Visit (INDEPENDENT_AMBULATORY_CARE_PROVIDER_SITE_OTHER): Payer: 59 | Admitting: Psychiatry

## 2021-03-07 ENCOUNTER — Encounter (HOSPITAL_COMMUNITY): Payer: Self-pay | Admitting: Psychiatry

## 2021-03-07 ENCOUNTER — Other Ambulatory Visit: Payer: Self-pay

## 2021-03-07 DIAGNOSIS — F3181 Bipolar II disorder: Secondary | ICD-10-CM

## 2021-03-07 DIAGNOSIS — F419 Anxiety disorder, unspecified: Secondary | ICD-10-CM | POA: Diagnosis not present

## 2021-03-07 MED ORDER — HYDROXYZINE HCL 10 MG PO TABS
10.0000 mg | ORAL_TABLET | Freq: Three times a day (TID) | ORAL | 3 refills | Status: AC | PRN
Start: 2021-03-07 — End: ?

## 2021-03-07 MED ORDER — LAMOTRIGINE 100 MG PO TABS: ORAL_TABLET | ORAL | 3 refills | Status: DC

## 2021-03-07 MED ORDER — VENLAFAXINE HCL ER 75 MG PO CP24: 75.0000 mg | ORAL_CAPSULE | Freq: Every day | ORAL | 3 refills | Status: DC
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
## 2021-03-10 ENCOUNTER — Ambulatory Visit (INDEPENDENT_AMBULATORY_CARE_PROVIDER_SITE_OTHER): Payer: 59 | Admitting: Psychology

## 2021-03-10 DIAGNOSIS — F3181 Bipolar II disorder: Secondary | ICD-10-CM | POA: Diagnosis not present

## 2021-03-24 ENCOUNTER — Ambulatory Visit (INDEPENDENT_AMBULATORY_CARE_PROVIDER_SITE_OTHER): Payer: 59 | Admitting: Psychology

## 2021-03-24 DIAGNOSIS — F3181 Bipolar II disorder: Secondary | ICD-10-CM | POA: Diagnosis not present

## 2021-04-07 ENCOUNTER — Ambulatory Visit (INDEPENDENT_AMBULATORY_CARE_PROVIDER_SITE_OTHER): Payer: 59 | Admitting: Psychology

## 2021-04-07 DIAGNOSIS — F3181 Bipolar II disorder: Secondary | ICD-10-CM

## 2021-04-19 ENCOUNTER — Ambulatory Visit (INDEPENDENT_AMBULATORY_CARE_PROVIDER_SITE_OTHER): Payer: 59 | Admitting: Psychology

## 2021-04-19 DIAGNOSIS — F3181 Bipolar II disorder: Secondary | ICD-10-CM

## 2021-05-03 ENCOUNTER — Ambulatory Visit (INDEPENDENT_AMBULATORY_CARE_PROVIDER_SITE_OTHER): Payer: 59 | Admitting: Psychology

## 2021-05-03 DIAGNOSIS — F3181 Bipolar II disorder: Secondary | ICD-10-CM | POA: Diagnosis not present

## 2021-05-17 ENCOUNTER — Ambulatory Visit (INDEPENDENT_AMBULATORY_CARE_PROVIDER_SITE_OTHER): Payer: 59 | Admitting: Psychology

## 2021-05-17 DIAGNOSIS — F3181 Bipolar II disorder: Secondary | ICD-10-CM | POA: Diagnosis not present

## 2021-05-31 ENCOUNTER — Ambulatory Visit (INDEPENDENT_AMBULATORY_CARE_PROVIDER_SITE_OTHER): Payer: 59 | Admitting: Psychology

## 2021-05-31 DIAGNOSIS — F3181 Bipolar II disorder: Secondary | ICD-10-CM | POA: Diagnosis not present
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
## 2021-05-31 NOTE — Addendum Note (Signed)
Addended by: Clarene Essex on: 05/31/2021 03:14 PM   Modules accepted: Level of Service

## 2021-06-28 ENCOUNTER — Ambulatory Visit: Payer: Self-pay | Admitting: Psychology

## 2021-07-06 ENCOUNTER — Other Ambulatory Visit (HOSPITAL_COMMUNITY): Payer: Self-pay | Admitting: Psychiatry

## 2021-07-06 DIAGNOSIS — F419 Anxiety disorder, unspecified: Secondary | ICD-10-CM

## 2021-07-06 DIAGNOSIS — F3181 Bipolar II disorder: Secondary | ICD-10-CM

## 2021-07-23 IMAGING — CR DG FINGER RING 2+V*R*
3 series · 3 of 3 positions shown · non-contrast
Comparison: None.

CLINICAL DATA: Dog bite to the right ring finger

EXAM:
RIGHT RING FINGER 2+V

[x finger pa right]
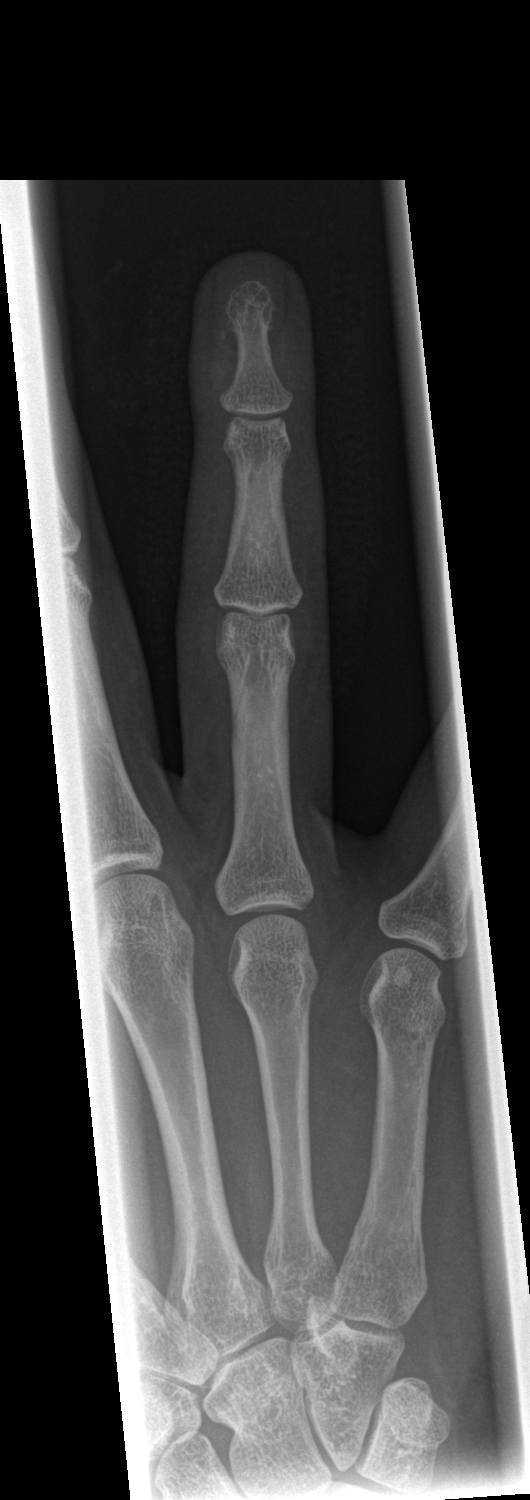

[x finger obl. right]
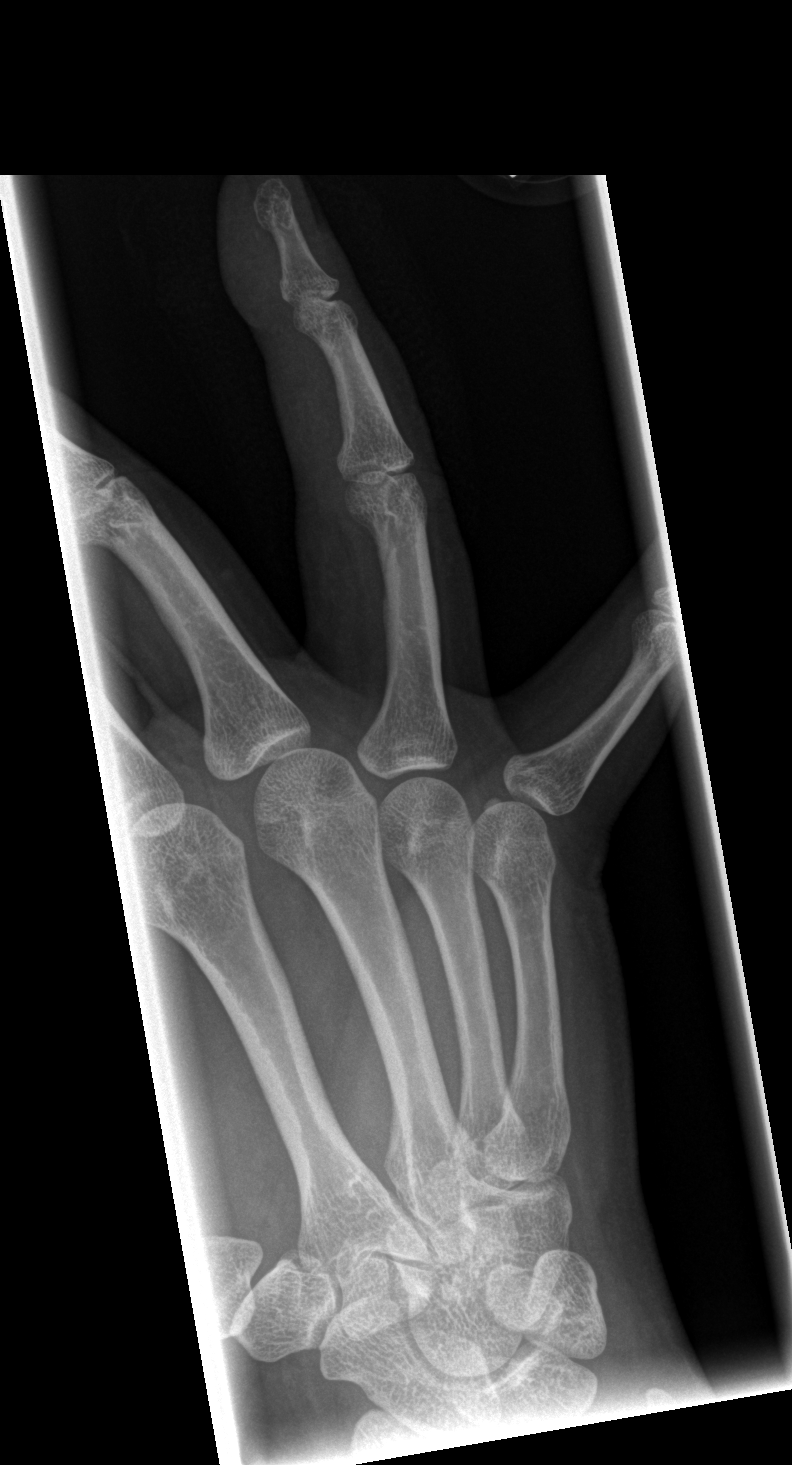

[x finger lateral right]
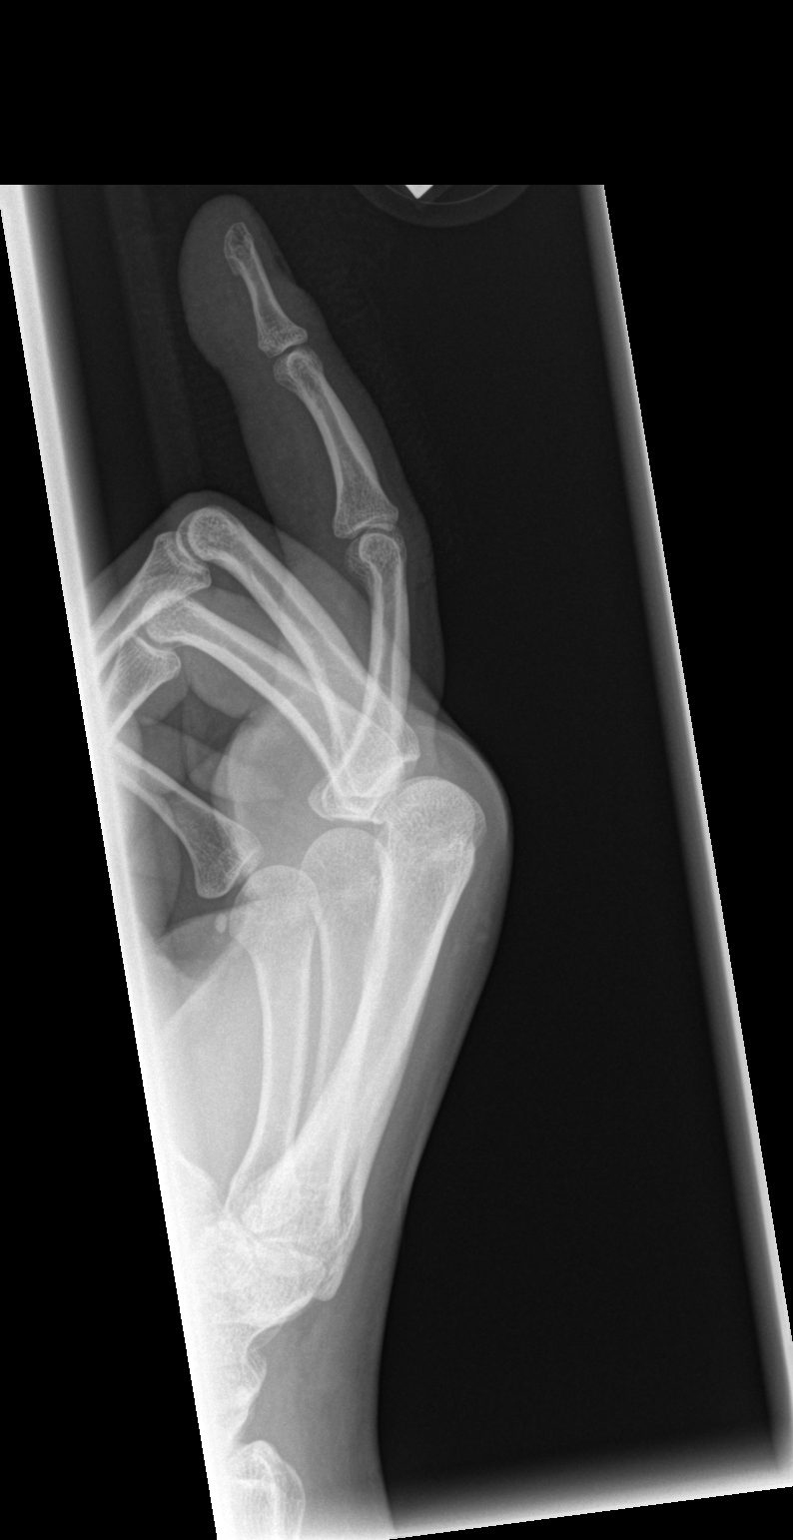

[3 of 3 positions shown; findings below may reference images not displayed]

FINDINGS: There is no evidence of fracture or dislocation. There is no
evidence of arthropathy or other focal bone abnormality. Soft
tissues are unremarkable.
IMPRESSION: Negative.

## 2021-07-30 ENCOUNTER — Other Ambulatory Visit (HOSPITAL_COMMUNITY): Payer: Self-pay | Admitting: Psychiatry

## 2021-07-30 DIAGNOSIS — F3181 Bipolar II disorder: Secondary | ICD-10-CM

## 2021-08-29 ENCOUNTER — Encounter (HOSPITAL_COMMUNITY): Payer: Self-pay | Admitting: Emergency Medicine

## 2021-08-29 ENCOUNTER — Other Ambulatory Visit: Payer: Self-pay

## 2021-08-29 ENCOUNTER — Inpatient Hospital Stay (HOSPITAL_COMMUNITY)
Admission: EM | Admit: 2021-08-29 | Discharge: 2021-08-31 | DRG: 418 | Disposition: A | Discharge status: Home / Self Care | Payer: Self-pay | Attending: Surgery | Admitting: Surgery

## 2021-08-29 ENCOUNTER — Emergency Department (HOSPITAL_COMMUNITY): Payer: Self-pay

## 2021-08-29 DIAGNOSIS — R1011 Right upper quadrant pain: Secondary | ICD-10-CM

## 2021-08-29 DIAGNOSIS — Z419 Encounter for procedure for purposes other than remedying health state, unspecified: Secondary | ICD-10-CM

## 2021-08-29 DIAGNOSIS — K8020 Calculus of gallbladder without cholecystitis without obstruction: Secondary | ICD-10-CM

## 2021-08-29 DIAGNOSIS — F3181 Bipolar II disorder: Secondary | ICD-10-CM | POA: Diagnosis present

## 2021-08-29 DIAGNOSIS — K8000 Calculus of gallbladder with acute cholecystitis without obstruction: Principal | ICD-10-CM | POA: Diagnosis present

## 2021-08-29 DIAGNOSIS — Z79899 Other long term (current) drug therapy: Secondary | ICD-10-CM

## 2021-08-29 DIAGNOSIS — E739 Lactose intolerance, unspecified: Secondary | ICD-10-CM | POA: Diagnosis present

## 2021-08-29 DIAGNOSIS — F1220 Cannabis dependence, uncomplicated: Secondary | ICD-10-CM | POA: Diagnosis present

## 2021-08-29 DIAGNOSIS — Z20822 Contact with and (suspected) exposure to covid-19: Secondary | ICD-10-CM | POA: Diagnosis present

## 2021-08-29 DIAGNOSIS — F17200 Nicotine dependence, unspecified, uncomplicated: Secondary | ICD-10-CM | POA: Diagnosis present

## 2021-08-29 DIAGNOSIS — Z880 Allergy status to penicillin: Secondary | ICD-10-CM

## 2021-08-29 HISTORY — DX: Depression, unspecified: F32.A

## 2021-08-29 HISTORY — DX: Bipolar II disorder: F31.81

## 2021-08-29 HISTORY — DX: Anxiety disorder, unspecified: F41.9

## 2021-08-29 LAB — CBC WITH DIFFERENTIAL/PLATELET
Abs Immature Granulocytes: 0.03 10*3/uL (ref 0.00–0.07)
Basophils Absolute: 0.1 10*3/uL (ref 0.0–0.1)
Basophils Relative: 1 %
Eosinophils Absolute: 0.2 10*3/uL (ref 0.0–0.5)
Eosinophils Relative: 3 %
HCT: 40.4 % (ref 36.0–46.0)
Hemoglobin: 13.7 g/dL (ref 12.0–15.0)
Immature Granulocytes: 0 %
Lymphocytes Relative: 22 %
Lymphs Abs: 1.9 10*3/uL (ref 0.7–4.0)
MCH: 30.6 pg (ref 26.0–34.0)
MCHC: 33.9 g/dL (ref 30.0–36.0)
MCV: 90.2 fL (ref 80.0–100.0)
Monocytes Absolute: 0.4 10*3/uL (ref 0.1–1.0)
Monocytes Relative: 5 %
Neutro Abs: 5.8 10*3/uL (ref 1.7–7.7)
Neutrophils Relative %: 69 %
Platelets: 330 10*3/uL (ref 150–400)
RBC: 4.48 MIL/uL (ref 3.87–5.11)
RDW: 12.1 % (ref 11.5–15.5)
WBC: 8.4 10*3/uL (ref 4.0–10.5)
nRBC: 0 % (ref 0.0–0.2)

## 2021-08-29 LAB — SURGICAL PCR SCREEN
MRSA, PCR: NEGATIVE
Staphylococcus aureus: NEGATIVE

## 2021-08-29 LAB — COMPREHENSIVE METABOLIC PANEL
ALT: 12 U/L (ref 0–44)
AST: 23 U/L (ref 15–41)
Albumin: 3.9 g/dL (ref 3.5–5.0)
Alkaline Phosphatase: 60 U/L (ref 38–126)
Anion gap: 8 (ref 5–15)
BUN: 13 mg/dL (ref 6–20)
CO2: 25 mmol/L (ref 22–32)
Calcium: 8.9 mg/dL (ref 8.9–10.3)
Chloride: 108 mmol/L (ref 98–111)
Creatinine, Ser: 0.77 mg/dL (ref 0.44–1.00)
GFR, Estimated: >60 mL/min (ref >60)
Glucose, Bld: 106 mg/dL — ABNORMAL HIGH (ref 70–99)
Potassium: 4.1 mmol/L (ref 3.5–5.1)
Sodium: 141 mmol/L (ref 135–145)
Total Bilirubin: 0.5 mg/dL (ref 0.3–1.2)
Total Protein: 6.8 g/dL (ref 6.5–8.1)

## 2021-08-29 LAB — RESP PANEL BY RT-PCR (FLU A&B, COVID) ARPGX2
Influenza A by PCR: NEGATIVE
Influenza B by PCR: NEGATIVE
SARS Coronavirus 2 by RT PCR: NEGATIVE

## 2021-08-29 LAB — I-STAT BETA HCG BLOOD, ED (MC, WL, AP ONLY): I-stat hCG, quantitative: <5 m[IU]/mL (ref <5)

## 2021-08-29 LAB — LIPASE, BLOOD: Lipase: 33 U/L (ref 11–51)

## 2021-08-29 MED ORDER — FENTANYL CITRATE PF 50 MCG/ML IJ SOSY
25.0000 ug | PREFILLED_SYRINGE | Freq: Once | INTRAMUSCULAR | Status: AC
  Administered 2021-08-29: 25 ug via INTRAVENOUS
  Filled 2021-08-29: qty 1

## 2021-08-29 MED ORDER — ACETAMINOPHEN 500 MG PO TABS
1000.0000 mg | ORAL_TABLET | ORAL | Status: AC
  Administered 2021-08-30: 1000 mg via ORAL
  Filled 2021-08-29: qty 2

## 2021-08-29 MED ORDER — ENOXAPARIN SODIUM 40 MG/0.4ML IJ SOSY
40.0000 mg | PREFILLED_SYRINGE | INTRAMUSCULAR | Status: DC
  Administered 2021-08-30: 40 mg via SUBCUTANEOUS
  Filled 2021-08-29: qty 0.4

## 2021-08-29 MED ORDER — MUPIROCIN 2 % EX OINT: 1.0000 "application " | TOPICAL_OINTMENT | Freq: Two times a day (BID) | CUTANEOUS | Status: DC

## 2021-08-29 MED ORDER — FAMOTIDINE IN NACL 20-0.9 MG/50ML-% IV SOLN
20.0000 mg | Freq: Once | INTRAVENOUS | Status: AC
  Administered 2021-08-29: 20 mg via INTRAVENOUS
  Filled 2021-08-29: qty 50

## 2021-08-29 MED ORDER — ACETAMINOPHEN 650 MG RE SUPP: 650.0000 mg | Freq: Four times a day (QID) | RECTAL | Status: DC | PRN

## 2021-08-29 MED ORDER — CHLORHEXIDINE GLUCONATE CLOTH 2 % EX PADS
6.0000 | MEDICATED_PAD | Freq: Every day | CUTANEOUS | Status: AC
  Administered 2021-08-30: 6 via TOPICAL

## 2021-08-29 MED ORDER — SODIUM CHLORIDE 0.9 % IV BOLUS
1000.0000 mL | Freq: Once | INTRAVENOUS | Status: AC
  Administered 2021-08-29: 1000 mL via INTRAVENOUS

## 2021-08-29 MED ORDER — ACETAMINOPHEN 325 MG PO TABS: 650.0000 mg | ORAL_TABLET | Freq: Four times a day (QID) | ORAL | Status: DC | PRN

## 2021-08-29 MED ORDER — POLYETHYLENE GLYCOL 3350 17 G PO PACK: 17.0000 g | PACK | Freq: Every day | ORAL | Status: DC | PRN

## 2021-08-29 MED ORDER — ONDANSETRON HCL 4 MG/2ML IJ SOLN: 4.0000 mg | Freq: Four times a day (QID) | INTRAMUSCULAR | Status: DC | PRN

## 2021-08-29 MED ORDER — ONDANSETRON HCL 4 MG/2ML IJ SOLN
4.0000 mg | Freq: Once | INTRAMUSCULAR | Status: AC
  Administered 2021-08-29: 4 mg via INTRAVENOUS
  Filled 2021-08-29: qty 2

## 2021-08-29 MED ORDER — METHOCARBAMOL 500 MG PO TABS: 500.0000 mg | ORAL_TABLET | Freq: Four times a day (QID) | ORAL | Status: DC | PRN

## 2021-08-29 MED ORDER — HYDRALAZINE HCL 20 MG/ML IJ SOLN: 10.0000 mg | INTRAMUSCULAR | Status: DC | PRN

## 2021-08-29 MED ORDER — ONDANSETRON HCL 4 MG/2ML IJ SOLN
4.0000 mg | Freq: Four times a day (QID) | INTRAMUSCULAR | Status: DC | PRN
  Administered 2021-08-30: 4 mg via INTRAVENOUS
  Filled 2021-08-29: qty 2

## 2021-08-29 MED ORDER — SODIUM CHLORIDE 0.9 % IV SOLN
2.0000 g | INTRAVENOUS | Status: AC
  Filled 2021-08-29: qty 20

## 2021-08-29 MED ORDER — DIPHENHYDRAMINE HCL 50 MG/ML IJ SOLN: 25.0000 mg | Freq: Four times a day (QID) | INTRAMUSCULAR | Status: DC | PRN

## 2021-08-29 MED ORDER — CHLORHEXIDINE GLUCONATE CLOTH 2 % EX PADS
6.0000 | MEDICATED_PAD | Freq: Every day | CUTANEOUS | Status: AC
  Administered 2021-08-29: 6 via TOPICAL

## 2021-08-29 MED ORDER — SIMETHICONE 80 MG PO CHEW: 40.0000 mg | CHEWABLE_TABLET | Freq: Four times a day (QID) | ORAL | Status: DC | PRN

## 2021-08-29 MED ORDER — LACTATED RINGERS IV SOLN: INTRAVENOUS | Status: DC

## 2021-08-29 MED ORDER — DIPHENHYDRAMINE HCL 25 MG PO CAPS: 25.0000 mg | ORAL_CAPSULE | Freq: Four times a day (QID) | ORAL | Status: DC | PRN

## 2021-08-29 MED ORDER — ONDANSETRON 4 MG PO TBDP: 4.0000 mg | ORAL_TABLET | Freq: Four times a day (QID) | ORAL | Status: DC | PRN

## 2021-08-29 MED ORDER — PANTOPRAZOLE SODIUM 40 MG IV SOLR
40.0000 mg | Freq: Every day | INTRAVENOUS | Status: DC
  Administered 2021-08-29 – 2021-08-30 (×2): 40 mg via INTRAVENOUS
  Filled 2021-08-29 (×2): qty 10

## 2021-08-29 MED ORDER — OXYCODONE HCL 5 MG PO TABS: 5.0000 mg | ORAL_TABLET | ORAL | Status: DC | PRN

## 2021-08-29 MED ORDER — MORPHINE SULFATE (PF) 2 MG/ML IV SOLN
2.0000 mg | INTRAVENOUS | Status: DC | PRN
Start: 2021-08-29 — End: 2021-08-30
  Administered 2021-08-30: 2 mg via INTRAVENOUS
  Filled 2021-08-29: qty 1

## 2021-08-29 NOTE — ED Triage Notes (Signed)
Pt c/o vomiting and severe R sided abdominal and flank pain. Pt was told a few months ago in [place] that she had gallstones, has been trying to avoid eating trigger foods. Pt had vomiting and diarrhea today.  ?

## 2021-08-29 NOTE — Plan of Care (Signed)

## 2021-08-29 NOTE — ED Provider Notes (Signed)
[facility] EMERGENCY DEPARTMENT Provider Note   CSN: [id number] Arrival date & time: 08/29/21  [id number]     History  Chief Complaint  Patient presents with   Abdominal Pain    Susan Nash is a 26 y.o. female presenting to the ED with a chief complaint of abdominal pain.  Reports right upper quadrant abdominal pain with associated nausea, vomiting.  No changes to bowel movements or urination.  Reports sharp pain that typically improves in the past when she vomits although this time she continues to have constant pain since 2 AM this morning.  She was told a few weeks ago that she had gallstones seen on ultrasound at urgent care although has not followed up with surgery regarding treatment.  She has tried Tums without much improvement in her symptoms.  No sick contacts with similar symptoms.  Denies any chest pain, shortness of breath, fever   Abdominal Pain Associated symptoms: nausea and vomiting   Associated symptoms: no chest pain, no chills, no constipation, no cough, no diarrhea, no dysuria, no fever, no hematuria, no shortness of breath and no sore throat       Home Medications Prior to Admission medications   Medication Sig Start Date End Date Taking? Authorizing Provider  calcium carbonate (TUMS EX) 750 MG chewable tablet Chew 1,500 mg by mouth daily as needed for heartburn.   Yes [provider]  Omeprazole-Sodium Bicarbonate (ZEGERID OTC PO) Take 1 tablet by mouth daily as needed (heartburn).   Yes [provider]  hydrOXYzine (ATARAX/VISTARIL) 10 MG tablet Take 1 tablet (10 mg total) by mouth 3 (three) times daily as needed. Patient not taking: Reported on 08/29/2021 03/07/21   Salley Slaughter, NP  lamoTRIgine (LAMICTAL) 100 MG tablet TAKE 1 TABLET DAILY FOR 2 WEEKS, THEN TAKE 2 TABLETS DAILY FOR 2 WEEKS, THEN 3 TABLETS DAILY Patient not taking: Reported on 08/29/2021 08/01/21   Salley Slaughter, NP  venlafaxine XR (EFFEXOR-XR) 75 MG  24 hr capsule TAKE 1 CAPSULE(75 MG) BY MOUTH DAILY WITH BREAKFAST Patient not taking: Reported on 08/29/2021 07/07/21   Salley Slaughter, NP      Allergies    Amoxicillin, Gluten meal, and Lactose intolerance (gi)    Review of Systems   Review of Systems  Constitutional:  Negative for appetite change, chills and fever.  HENT:  Negative for ear pain, rhinorrhea, sneezing and sore throat.   Eyes:  Negative for photophobia and visual disturbance.  Respiratory:  Negative for cough, chest tightness, shortness of breath and wheezing.   Cardiovascular:  Negative for chest pain and palpitations.  Gastrointestinal:  Positive for abdominal pain, nausea and vomiting. Negative for blood in stool, constipation and diarrhea.  Genitourinary:  Negative for dysuria, hematuria and urgency.  Musculoskeletal:  Negative for myalgias.  Skin:  Negative for rash.  Neurological:  Negative for dizziness, weakness and light-headedness.   Physical Exam Updated Vital Signs BP 98/64    Pulse 68    Temp 98.1 F (36.7 C) (Oral)    Resp 16    Ht 5\' 4"  (1.626 m)    Wt 77.1 kg    SpO2 100%    BMI 29.18 kg/m  Physical Exam Vitals and nursing note reviewed.  Constitutional:      General: She is not in acute distress.    Appearance: She is well-developed.  HENT:     Head: Normocephalic and atraumatic.     Nose: Nose normal.  Eyes:  General: No scleral icterus.       Left eye: No discharge.     Conjunctiva/sclera: Conjunctivae normal.  Cardiovascular:     Rate and Rhythm: Normal rate and regular rhythm.     Heart sounds: Normal heart sounds. No murmur heard.   No friction rub. No gallop.  Pulmonary:     Effort: Pulmonary effort is normal. No respiratory distress.     Breath sounds: Normal breath sounds.  Abdominal:     General: Bowel sounds are normal. There is no distension.     Palpations: Abdomen is soft.     Tenderness: There is abdominal tenderness in the right upper quadrant. There is no guarding.   Musculoskeletal:        General: Normal range of motion.     Cervical back: Normal range of motion and neck supple.  Skin:    General: Skin is warm and dry.     Findings: No rash.  Neurological:     Mental Status: She is alert.     Motor: No abnormal muscle tone.     Coordination: Coordination normal.    ED Results / Procedures / Treatments   Labs (all labs ordered are listed, but only abnormal results are displayed) Labs Reviewed  COMPREHENSIVE METABOLIC PANEL - Abnormal; Notable for the following components:      Result Value   Glucose, Bld 106 (*)    All other components within normal limits  RESP PANEL BY RT-PCR (FLU A&B, COVID) ARPGX2  LIPASE, BLOOD  CBC WITH DIFFERENTIAL/PLATELET  I-STAT BETA HCG BLOOD, ED ([facility], [facility], [facility] ONLY)    EKG None  Radiology US Abdomen Limited RUQ (LIVER/GB)  Result Date: 08/29/2021 CLINICAL DATA:  Right upper quadrant pain EXAM: ULTRASOUND ABDOMEN LIMITED RIGHT UPPER QUADRANT COMPARISON:  None. FINDINGS: Gallbladder: 2.2 cm echogenic shadowing calculus in the neck of the gallbladder. No significant wall thickening or pericholecystic fluid. Positive sonographic Murphy's sign. Common bile duct: Diameter: 4 mm Liver: No focal lesion identified. Within normal limits in parenchymal echogenicity. Portal vein is patent on color Doppler imaging with normal direction of blood flow towards the liver. Other: None. IMPRESSION: Cholelithiasis and positive sonographic Murphy's sign, suggestive of acute calculus cholecystitis. Electronically Signed   By: Ofilia Neas M.D.   On: 08/29/2021 09:36    Procedures Procedures    Medications Ordered in ED Medications  acetaminophen (TYLENOL) tablet 1,000 mg (has no administration in time range)  cefTRIAXone (ROCEPHIN) 2 g in sodium chloride 0.9 % 100 mL IVPB (has no administration in time range)  morphine (PF) 2 MG/ML injection 2 mg (has no administration in time range)  ondansetron (ZOFRAN) injection 4 mg (has  no administration in time range)  sodium chloride 0.9 % bolus 1,000 mL (0 mLs Intravenous Stopped 08/29/21 0927)  ondansetron (ZOFRAN) injection 4 mg (4 mg Intravenous Given 08/29/21 0842)  famotidine (PEPCID) IVPB 20 mg premix (0 mg Intravenous Stopped 08/29/21 0926)  fentaNYL (SUBLIMAZE) injection 25 mcg (25 mcg Intravenous Given 08/29/21 0906)  sodium chloride 0.9 % bolus 1,000 mL (1,000 mLs Intravenous New Bag/Given 08/29/21 1109)    ED Course/ Medical Decision Making/ A&P Clinical Course as of 08/29/21 1122  Mon Aug 29, 2021  0857 I-stat hCG, quantitative: <5.0 [HK]  0902 WBC: 8.4 [HK]  0948 AST: 23 [HK]  0948 ALT: 12 [HK]  0948 Total Bilirubin: 0.5 [HK]  [id number] Spoke to surgery PA Brooke.  She states that if her symptoms are still present they can consider cholecystectomy  and admission. [HK]    Clinical Course User Index [HK] Delia Heady, PA-C                           Medical Decision Making Amount and/or Complexity of Data Reviewed Labs: ordered. Decision-making details documented in ED Course. Radiology: ordered.  Risk Prescription drug management. Decision regarding hospitalization.   26 year old female presenting to the ED for right upper quadrant pain, nausea, vomiting.  She has a known history of gallstones.  Minimal improvement noted with Tums taken at home.  This current episode began approximately 7 hours ago.  On exam there is tenderness of the right upper quadrant without rebound or guarding.  Labs ordered and interpreted.  Unremarkable CBC, CMP with normal LFTs and T. bili.  hCG is negative.  Right upper quadrant ultrasound shows Coley lithiasis with a gallstone in the gallbladder neck.  Suspect this is the cause of her symptoms today.  On recheck patient states that symptoms have improved but is still symptomatic.  After speaking to general surgery team they will evaluate the patient for potential admission for treatment. Patient is agreeable to plan.     Portions  of this note were generated with [employer]. Dictation errors may occur despite best attempts at proofreading.         Final Clinical Impression(s) / ED Diagnoses Final diagnoses:  RUQ pain  Symptomatic cholelithiasis    Rx / DC Orders ED Discharge Orders     None         Delia Heady, PA-C 08/29/21 1122    Gareth Morgan, MD 08/29/21 2327

## 2021-08-29 NOTE — Anesthesia Preprocedure Evaluation (Addendum)
Anesthesia Evaluation  Patient identified by MRN, date of birth, ID band Patient awake    Reviewed: Allergy & Precautions, NPO status , Patient's Chart, lab work & pertinent test results  Airway        Dental   Pulmonary neg pulmonary ROS, Current Smoker,           Cardiovascular negative cardio ROS       Neuro/Psych PSYCHIATRIC DISORDERS Anxiety Depression Bipolar Disorder negative neurological ROS     GI/Hepatic Neg liver ROS, cholecystitis   Endo/Other  negative endocrine ROS  Renal/GU negative Renal ROS  negative genitourinary   Musculoskeletal negative musculoskeletal ROS (+)   Abdominal   Peds negative pediatric ROS (+)  Hematology negative hematology ROS (+)   Anesthesia Other Findings   Reproductive/Obstetrics negative OB ROS                             Anesthesia Physical Anesthesia Plan  ASA: 2 and emergent  Anesthesia Plan: General   Post-op Pain Management: Tylenol PO (pre-op)* and Minimal or no pain anticipated   Induction: Intravenous  PONV Risk Score and Plan: 2 and Treatment may vary due to age or medical condition, Ondansetron, Dexamethasone, Midazolam and Scopolamine patch - Pre-op  Airway Management Planned: Oral ETT  Additional Equipment: None  Intra-op Plan:   Post-operative Plan: Extubation in OR  Informed Consent: I have reviewed the patients History and Physical, chart, labs and discussed the procedure including the risks, benefits and alternatives for the proposed anesthesia with the patient or authorized representative who has indicated his/her understanding and acceptance.       Plan Discussed with: CRNA, Anesthesiologist and Surgeon  Anesthesia Plan Comments:         Anesthesia Quick Evaluation

## 2021-08-29 NOTE — H&P (Signed)
[facility] Admission Note  Susan Nash 05/25/1996  [id number].    Requesting MD: Alvira Monday Chief Complaint/Reason for Consult: gallstones  HPI:  Susan Nash is a 26yo female who presented to [facility] today complaining of worsening abdominal pain. She has a known h/o gallstones and reports symptoms dating back to 1-2 years ago. States that her symptoms have become more frequent and more severe. She reports eating [place] food last night then waking up at 0200 this morning with severe RUQ pain. Associated symptoms include nausea and vomiting. States that typically when she vomits her pain improves, but this time it did not so she came to the ED. In the ED her labwork was unremarkable with WBC 8.4, and LFTs and lipase WNL. U/s shows a 2.2 cm echogenic shadowing calculus in the neck of the gallbladder; positive Murphy's sign but no other findings suggestive of acute cholecystitis.  She was given pain medication with some benefit but symptoms did not fully resolve. General surgery asked to see.  She has had nothing to eat/drink since midnight.  Abdominal surgical history: none Anticoagulants: none Vapes Smokes THC daily Denies alcohol use  Review of Systems  Constitutional: Negative.   Gastrointestinal:  Positive for abdominal pain, nausea and vomiting.   All systems reviewed and otherwise negative except for as above  No family history on file.  Past Medical History:  Diagnosis Date   Anxiety    Bipolar 2 disorder (HCC)    Depression     Past Surgical History:  Procedure Laterality Date   ANKLE ARTHROSCOPY WITH RECONSTRUCTION      Social History:  reports that she has been smoking. She has never used smokeless tobacco.  Drug: Marijuana. She reports that she does not drink alcohol.  Allergies:  Allergies  Allergen Reactions   Amoxicillin Hives   Gluten Meal Nausea Only   Lactose Intolerance (Gi) Nausea Only    Cannot drink milk even with Lactaid     (Not in a hospital admission)   Prior to Admission medications   Medication Sig Start Date End Date Taking? Authorizing Provider  calcium carbonate (TUMS EX) 750 MG chewable tablet Chew 1,500 mg by mouth daily as needed for heartburn.   Yes [provider]  Omeprazole-Sodium Bicarbonate (ZEGERID OTC PO) Take 1 tablet by mouth daily as needed (heartburn).   Yes [provider]  hydrOXYzine (ATARAX/VISTARIL) 10 MG tablet Take 1 tablet (10 mg total) by mouth 3 (three) times daily as needed. Patient not taking: Reported on 08/29/2021 03/07/21   Shanna Cisco, NP  lamoTRIgine (LAMICTAL) 100 MG tablet TAKE 1 TABLET DAILY FOR 2 WEEKS, THEN TAKE 2 TABLETS DAILY FOR 2 WEEKS, THEN 3 TABLETS DAILY Patient not taking: Reported on 08/29/2021 08/01/21   Shanna Cisco, NP  venlafaxine XR (EFFEXOR-XR) 75 MG 24 hr capsule TAKE 1 CAPSULE(75 MG) BY MOUTH DAILY WITH BREAKFAST Patient not taking: Reported on 08/29/2021 07/07/21   Toy Cookey E, NP    Blood pressure 98/64, pulse 68, temperature 98.1 F (36.7 C), temperature source Oral, resp. rate 16, height 5\' 4"  (1.626 m), weight 77.1 kg, SpO2 100 %. Physical Exam: General: pleasant, WD/WN female who is laying in bed in NAD HEENT: head is normocephalic, atraumatic.  Sclera are noninjected.  Pupils equal and round.  Ears and nose without any masses or lesions.  Mouth is pink and moist. Dentition fair Heart: regular, rate, and rhythm.  Normal s1,s2. No obvious murmurs, gallops, or rubs noted.  Palpable pedal  pulses bilaterally  Lungs: CTAB, no wheezes, rhonchi, or rales noted.  Respiratory effort nonlabored Abd: soft, ND, +BS, no masses, hernias, or organomegaly. TTP RUQ and epigastric region MS: no BUE/BLE edema, calves soft and nontender Skin: warm and dry with no masses, lesions, or rashes Psych: A&Ox4 with an appropriate affect Neuro: cranial nerves grossly intact, equal strength in BUE/BLE bilaterally, normal speech,  thought process intact  Results for orders placed or performed during the hospital encounter of 08/29/21 (from the past 48 hour(s))  Comprehensive metabolic panel     Status: Abnormal   Collection Time: 08/29/21  8:36 AM  Result Value Ref Range   Sodium 141 135 - 145 mmol/L   Potassium 4.1 3.5 - 5.1 mmol/L   Chloride 108 98 - 111 mmol/L   CO2 25 22 - 32 mmol/L   Glucose, Bld 106 (H) 70 - 99 mg/dL    Comment: Glucose reference range applies only to samples taken after fasting for at least 8 hours.   BUN 13 6 - 20 mg/dL   Creatinine, Ser 1.61 0.44 - 1.00 mg/dL   Calcium 8.9 8.9 - 09.6 mg/dL   Total Protein 6.8 6.5 - 8.1 g/dL   Albumin 3.9 3.5 - 5.0 g/dL   AST 23 15 - 41 U/L   ALT 12 0 - 44 U/L   Alkaline Phosphatase 60 38 - 126 U/L   Total Bilirubin 0.5 0.3 - 1.2 mg/dL   GFR, Estimated >04 >54 mL/min    Comment: (NOTE) Calculated using the CKD-EPI Creatinine Equation (2021)    Anion gap 8 5 - 15    Comment: Performed at [facility], [address], [place], [place] [address]  Lipase, blood     Status: None   Collection Time: 08/29/21  8:36 AM  Result Value Ref Range   Lipase 33 11 - 51 U/L    Comment: Performed at [facility], [address], [place], [place] [address]  CBC with Differential     Status: None   Collection Time: 08/29/21  8:36 AM  Result Value Ref Range   WBC 8.4 4.0 - 10.5 K/uL   RBC 4.48 3.87 - 5.11 MIL/uL   Hemoglobin 13.7 12.0 - 15.0 g/dL   HCT 29.5 62.1 - 30.8 %   MCV 90.2 80.0 - 100.0 fL   MCH 30.6 26.0 - 34.0 pg   MCHC 33.9 30.0 - 36.0 g/dL   RDW 65.7 84.6 - 96.2 %   Platelets 330 150 - 400 K/uL   nRBC 0.0 0.0 - 0.2 %   Neutrophils Relative % 69 %   Neutro Abs 5.8 1.7 - 7.7 K/uL   Lymphocytes Relative 22 %   Lymphs Abs 1.9 0.7 - 4.0 K/uL   Monocytes Relative 5 %   Monocytes Absolute 0.4 0.1 - 1.0 K/uL   Eosinophils Relative 3 %   Eosinophils Absolute 0.2 0.0 - 0.5 K/uL   Basophils Relative 1 %   Basophils Absolute 0.1 0.0 - 0.1  K/uL   Immature Granulocytes 0 %   Abs Immature Granulocytes 0.03 0.00 - 0.07 K/uL    Comment: Performed at [facility], [address], [place], [place] [address]  I-Stat beta hCG blood, ED     Status: None   Collection Time: 08/29/21  8:40 AM  Result Value Ref Range   I-stat hCG, quantitative <5.0 <5 mIU/mL   Comment 3  Comment:   GEST. AGE      CONC.  (mIU/mL)   <=1 WEEK        5 - 50     2 WEEKS       50 - 500     3 WEEKS       100 - 10,000     4 WEEKS     1,000 - 30,000        FEMALE AND NON-PREGNANT FEMALE:     LESS THAN 5 mIU/mL    US Abdomen Limited RUQ (LIVER/GB)  Result Date: 08/29/2021 CLINICAL DATA:  Right upper quadrant pain EXAM: ULTRASOUND ABDOMEN LIMITED RIGHT UPPER QUADRANT COMPARISON:  None. FINDINGS: Gallbladder: 2.2 cm echogenic shadowing calculus in the neck of the gallbladder. No significant wall thickening or pericholecystic fluid. Positive sonographic Murphy's sign. Common bile duct: Diameter: 4 mm Liver: No focal lesion identified. Within normal limits in parenchymal echogenicity. Portal vein is patent on color Doppler imaging with normal direction of blood flow towards the liver. Other: None. IMPRESSION: Cholelithiasis and positive sonographic Murphy's sign, suggestive of acute calculus cholecystitis. Electronically Signed   By: Jannifer Hickelaney  Williams M.D.   On: 08/29/2021 09:36      Assessment/Plan Symptomatic cholelithiasis, possible early acute cholecystitis  - Patient with worsening RUQ pain, nausea, and vomiting. U/s shows a 2.2 cm echogenic shadowing calculus in the neck of the gallbladder; positive Murphy's sign but no other findings suggestive of acute cholecystitis. Her WBC and LFTs are WNL. She remains tender in the RUQ on abdominal exam after pain medication. Recommend admission for laparoscopic cholecystectomy. Keep NPO for now. OR later today if time allows.    THC dependence Nicotine dependence   I reviewed ED provider notes, vitals  and pain scores since presentation, labs and trends, and imaging results   Franne FortsBrooke A Sharnette Kitamura, [facility] Center[facility] 08/29/2021, 11:21 AM Please see Amion for pager number during day hours 7:00am-4:30pm

## 2021-08-29 NOTE — ED Notes (Signed)
ED TO INPATIENT HANDOFF REPORT  ED Nurse Name and Phone #: Angelic Schnelle RN [phone]  S Name/Age/Gender Susan Nash 26 y.o. female Room/Bed: 042C/042C  Code Status   Code Status: Full Code  Home/SNF/Other Home Patient oriented to: self, place, time, and situation Is this baseline? Yes   Triage Complete: Triage complete  Chief Complaint Symptomatic cholelithiasis [K80.20]  Triage Note Pt c/o vomiting and severe R sided abdominal and flank pain. Pt was told a few months ago in [place] that she had gallstones, has been trying to avoid eating trigger foods. Pt had vomiting and diarrhea today.    Allergies Allergies  Allergen Reactions   Amoxicillin Hives   Gluten Meal Nausea Only   Lactose Intolerance (Gi) Nausea Only    Cannot drink milk even with Lactaid    Level of Care/Admitting Diagnosis ED Disposition     ED Disposition  Admit   Condition  --   Comment  Hospital Area: [facility] [[id number]]  Level of Care: Med-Surg [16]  May place patient in observation at [facility] or [facility] if equivalent level of care is available:: No  Covid Evaluation: Asymptomatic Screening Protocol (No Symptoms)  Diagnosis: Symptomatic cholelithiasis [494496]  Admitting Physician: CCS, MD [[id number]]  Attending Physician: CCS, MD [[id number]]  Bed request comments: prefer 6n          B Medical/Surgery History Past Medical History:  Diagnosis Date   Anxiety    Bipolar 2 disorder (HCC)    Depression    Past Surgical History:  Procedure Laterality Date   ANKLE ARTHROSCOPY WITH RECONSTRUCTION       A IV Location/Drains/Wounds Patient Lines/Drains/Airways Status     Active Line/Drains/Airways     Name Placement date Placement time Site Days   Peripheral IV 08/29/21 20 G Right Antecubital 08/29/21  0837  Antecubital  less than 1            Intake/Output Last 24 hours No intake or output data in the 24 hours ending 08/29/21 1606  Labs/Imaging Results for  orders placed or performed during the hospital encounter of 08/29/21 (from the past 48 hour(s))  Comprehensive metabolic panel     Status: Abnormal   Collection Time: 08/29/21  8:36 AM  Result Value Ref Range   Sodium 141 135 - 145 mmol/L   Potassium 4.1 3.5 - 5.1 mmol/L   Chloride 108 98 - 111 mmol/L   CO2 25 22 - 32 mmol/L   Glucose, Bld 106 (H) 70 - 99 mg/dL    Comment: Glucose reference range applies only to samples taken after fasting for at least 8 hours.   BUN 13 6 - 20 mg/dL   Creatinine, Ser 7.59 0.44 - 1.00 mg/dL   Calcium 8.9 8.9 - 16.3 mg/dL   Total Protein 6.8 6.5 - 8.1 g/dL   Albumin 3.9 3.5 - 5.0 g/dL   AST 23 15 - 41 U/L   ALT 12 0 - 44 U/L   Alkaline Phosphatase 60 38 - 126 U/L   Total Bilirubin 0.5 0.3 - 1.2 mg/dL   GFR, Estimated >84 >66 mL/min    Comment: (NOTE) Calculated using the CKD-EPI Creatinine Equation (2021)    Anion gap 8 5 - 15    Comment: Performed at [facility], [address], [place], [place] [address]  Lipase, blood     Status: None   Collection Time: 08/29/21  8:36 AM  Result Value Ref Range   Lipase 33 11 -  51 U/L    Comment: Performed at [facility], [address], [place], [place] [address]  CBC with Differential     Status: None   Collection Time: 08/29/21  8:36 AM  Result Value Ref Range   WBC 8.4 4.0 - 10.5 K/uL   RBC 4.48 3.87 - 5.11 MIL/uL   Hemoglobin 13.7 12.0 - 15.0 g/dL   HCT 46.9 62.9 - 52.8 %   MCV 90.2 80.0 - 100.0 fL   MCH 30.6 26.0 - 34.0 pg   MCHC 33.9 30.0 - 36.0 g/dL   RDW 41.3 24.4 - 01.0 %   Platelets 330 150 - 400 K/uL   nRBC 0.0 0.0 - 0.2 %   Neutrophils Relative % 69 %   Neutro Abs 5.8 1.7 - 7.7 K/uL   Lymphocytes Relative 22 %   Lymphs Abs 1.9 0.7 - 4.0 K/uL   Monocytes Relative 5 %   Monocytes Absolute 0.4 0.1 - 1.0 K/uL   Eosinophils Relative 3 %   Eosinophils Absolute 0.2 0.0 - 0.5 K/uL   Basophils Relative 1 %   Basophils Absolute 0.1 0.0 - 0.1 K/uL   Immature Granulocytes 0 %    Abs Immature Granulocytes 0.03 0.00 - 0.07 K/uL    Comment: Performed at [facility], [address], [place], [place] [address]  I-Stat beta hCG blood, ED     Status: None   Collection Time: 08/29/21  8:40 AM  Result Value Ref Range   I-stat hCG, quantitative <5.0 <5 mIU/mL   Comment 3            Comment:   GEST. AGE      CONC.  (mIU/mL)   <=1 WEEK        5 - 50     2 WEEKS       50 - 500     3 WEEKS       100 - 10,000     4 WEEKS     1,000 - 30,000        FEMALE AND NON-PREGNANT FEMALE:     LESS THAN 5 mIU/mL   Resp Panel by RT-PCR (Flu A&B, Covid) Nasopharyngeal Swab     Status: None   Collection Time: 08/29/21 10:40 AM   Specimen: Nasopharyngeal Swab; Nasopharyngeal(NP) swabs in vial transport medium  Result Value Ref Range   SARS Coronavirus 2 by RT PCR NEGATIVE NEGATIVE    Comment: (NOTE) SARS-CoV-2 target nucleic acids are NOT DETECTED.  The SARS-CoV-2 RNA is generally detectable in upper respiratory specimens during the acute phase of infection. The lowest concentration of SARS-CoV-2 viral copies this assay can detect is 138 copies/mL. A negative result does not preclude SARS-Cov-2 infection and should not be used as the sole basis for treatment or other patient management decisions. A negative result may occur with  improper specimen collection/handling, submission of specimen other than nasopharyngeal swab, presence of viral mutation(s) within the areas targeted by this assay, and inadequate number of viral copies(<138 copies/mL). A negative result must be combined with clinical observations, patient history, and epidemiological information. The expected result is Negative.  Fact Sheet for Patients:  BloggerCourse.com  Fact Sheet for Healthcare Providers:  SeriousBroker.it  This test is no t yet approved or cleared by the [place] FDA and  has been authorized for detection and/or diagnosis of SARS-CoV-2  by FDA under an Emergency Use Authorization (EUA). This EUA will remain  in effect (meaning this test  can be used) for the duration of the COVID-19 declaration under Section 564(b)(1) of the Act, 21 U.S.C.section 360bbb-3(b)(1), unless the authorization is terminated  or revoked sooner.       Influenza A by PCR NEGATIVE NEGATIVE   Influenza B by PCR NEGATIVE NEGATIVE    Comment: (NOTE) The Xpert Xpress SARS-CoV-2/FLU/RSV plus assay is intended as an aid in the diagnosis of influenza from Nasopharyngeal swab specimens and should not be used as a sole basis for treatment. Nasal washings and aspirates are unacceptable for Xpert Xpress SARS-CoV-2/FLU/RSV testing.  Fact Sheet for Patients: BloggerCourse.comhttps://www.fda.gov/media/152166/download  Fact Sheet for Healthcare Providers: SeriousBroker.ithttps://www.fda.gov/media/152162/download  This test is not yet approved or cleared by the [place] FDA and has been authorized for detection and/or diagnosis of SARS-CoV-2 by FDA under an Emergency Use Authorization (EUA). This EUA will remain in effect (meaning this test can be used) for the duration of the COVID-19 declaration under Section 564(b)(1) of the Act, 21 U.S.C. section 360bbb-3(b)(1), unless the authorization is terminated or revoked.  Performed at [facility], [address] [place], [place] [id number]    US Abdomen Limited RUQ (LIVER/GB)  Result Date: 08/29/2021 CLINICAL DATA:  Right upper quadrant pain EXAM: ULTRASOUND ABDOMEN LIMITED RIGHT UPPER QUADRANT COMPARISON:  None. FINDINGS: Gallbladder: 2.2 cm echogenic shadowing calculus in the neck of the gallbladder. No significant wall thickening or pericholecystic fluid. Positive sonographic Murphy's sign. Common bile duct: Diameter: 4 mm Liver: No focal lesion identified. Within normal limits in parenchymal echogenicity. Portal vein is patent on color Doppler imaging with normal direction of blood flow towards the liver. Other: None.  IMPRESSION: Cholelithiasis and positive sonographic Murphy's sign, suggestive of acute calculus cholecystitis. Electronically Signed   By: Jannifer Hickelaney  Williams M.D.   On: 08/29/2021 09:36    Pending Labs Unresulted Labs (From admission, onward)     Start     Ordered   09/05/21 0500  Creatinine, serum  (enoxaparin (LOVENOX)    CrCl >/= 30 ml/min)  Weekly,   R     Comments: while on enoxaparin therapy    08/29/21 1245   08/30/21 0500  Comprehensive metabolic panel  Tomorrow morning,   R        08/29/21 1245   08/30/21 0500  CBC  Tomorrow morning,   R        08/29/21 1245   08/29/21 1244  HIV Antibody (routine testing w rflx)  (HIV Antibody (Routine testing w reflex) panel)  Once,   R        08/29/21 1245            Vitals/Pain Today's Vitals   08/29/21 1030 08/29/21 1100 08/29/21 1211 08/29/21 1215  BP: (!) 84/53 98/64 (!) 93/55 (!) 95/54  Pulse: 66 68 62 (!) 56  Resp: 17 16 16    Temp:      TempSrc:      SpO2: 99% 100% 100% 99%  Weight:      Height:      PainSc:        Isolation Precautions No active isolations  Medications Medications  acetaminophen (TYLENOL) tablet 1,000 mg (0 mg Oral Hold 08/29/21 1339)  cefTRIAXone (ROCEPHIN) 2 g in sodium chloride 0.9 % 100 mL IVPB (0 g Intravenous Hold 08/29/21 1339)  morphine (PF) 2 MG/ML injection 2 mg (has no administration in time range)  enoxaparin (LOVENOX) injection 40 mg (0 mg Subcutaneous Hold 08/29/21 1356)  hydrALAZINE (APRESOLINE) injection 10 mg (has no administration in time  range)  pantoprazole (PROTONIX) injection 40 mg (has no administration in time range)  simethicone (MYLICON) chewable tablet 40 mg (has no administration in time range)  ondansetron (ZOFRAN-ODT) disintegrating tablet 4 mg (has no administration in time range)    Or  ondansetron (ZOFRAN) injection 4 mg (has no administration in time range)  polyethylene glycol (MIRALAX / GLYCOLAX) packet 17 g (has no administration in time range)  diphenhydrAMINE  (BENADRYL) capsule 25 mg (has no administration in time range)    Or  diphenhydrAMINE (BENADRYL) injection 25 mg (has no administration in time range)  methocarbamol (ROBAXIN) tablet 500 mg (has no administration in time range)  acetaminophen (TYLENOL) tablet 650 mg (has no administration in time range)    Or  acetaminophen (TYLENOL) suppository 650 mg (has no administration in time range)  oxyCODONE (Oxy IR/ROXICODONE) immediate release tablet 5-10 mg (has no administration in time range)  lactated ringers infusion ( Intravenous New Bag/Given 08/29/21 1404)  sodium chloride 0.9 % bolus 1,000 mL (0 mLs Intravenous Stopped 08/29/21 0927)  ondansetron (ZOFRAN) injection 4 mg (4 mg Intravenous Given 08/29/21 0842)  famotidine (PEPCID) IVPB 20 mg premix (0 mg Intravenous Stopped 08/29/21 0926)  fentaNYL (SUBLIMAZE) injection 25 mcg (25 mcg Intravenous Given 08/29/21 0906)  sodium chloride 0.9 % bolus 1,000 mL (0 mLs Intravenous Stopped 08/29/21 1232)    Mobility walks with person assist Low fall risk   Focused Assessments    R Recommendations: See Admitting Provider Note  Report given to: Lu Duffel RN  Additional Notes:

## 2021-08-30 ENCOUNTER — Observation Stay (HOSPITAL_COMMUNITY): Payer: Self-pay

## 2021-08-30 ENCOUNTER — Encounter (HOSPITAL_COMMUNITY): Payer: Self-pay

## 2021-08-30 ENCOUNTER — Emergency Department (HOSPITAL_COMMUNITY): Payer: Self-pay | Admitting: Anesthesiology

## 2021-08-30 ENCOUNTER — Encounter (HOSPITAL_COMMUNITY): Admission: EM | Disposition: A | Discharge status: Home / Self Care | Payer: Self-pay | Source: Home / Self Care

## 2021-08-30 ENCOUNTER — Other Ambulatory Visit: Payer: Self-pay

## 2021-08-30 DIAGNOSIS — K8018 Calculus of gallbladder with other cholecystitis without obstruction: Secondary | ICD-10-CM

## 2021-08-30 LAB — CBC
HCT: 37 % (ref 36.0–46.0)
Hemoglobin: 12.8 g/dL (ref 12.0–15.0)
MCH: 30.8 pg (ref 26.0–34.0)
MCHC: 34.6 g/dL (ref 30.0–36.0)
MCV: 89.2 fL (ref 80.0–100.0)
Platelets: 241 10*3/uL (ref 150–400)
RBC: 4.15 MIL/uL (ref 3.87–5.11)
RDW: 12.1 % (ref 11.5–15.5)
WBC: 7 10*3/uL (ref 4.0–10.5)
nRBC: 0 % (ref 0.0–0.2)

## 2021-08-30 LAB — COMPREHENSIVE METABOLIC PANEL
ALT: 12 U/L (ref 0–44)
AST: 17 U/L (ref 15–41)
Albumin: 3.2 g/dL — ABNORMAL LOW (ref 3.5–5.0)
Alkaline Phosphatase: 45 U/L (ref 38–126)
Anion gap: 7 (ref 5–15)
BUN: 5 mg/dL — ABNORMAL LOW (ref 6–20)
CO2: 22 mmol/L (ref 22–32)
Calcium: 8.6 mg/dL — ABNORMAL LOW (ref 8.9–10.3)
Chloride: 110 mmol/L (ref 98–111)
Creatinine, Ser: 0.63 mg/dL (ref 0.44–1.00)
GFR, Estimated: >60 mL/min (ref >60)
Glucose, Bld: 90 mg/dL (ref 70–99)
Potassium: 3.6 mmol/L (ref 3.5–5.1)
Sodium: 139 mmol/L (ref 135–145)
Total Bilirubin: 0.5 mg/dL (ref 0.3–1.2)
Total Protein: 5.7 g/dL — ABNORMAL LOW (ref 6.5–8.1)

## 2021-08-30 LAB — HIV ANTIBODY (ROUTINE TESTING W REFLEX): HIV Screen 4th Generation wRfx: NONREACTIVE

## 2021-08-30 SURGERY — LAPAROSCOPIC CHOLECYSTECTOMY WITH INTRAOPERATIVE CHOLANGIOGRAM
Anesthesia: General | Site: Abdomen

## 2021-08-30 MED ORDER — 0.9 % SODIUM CHLORIDE (POUR BTL) OPTIME
TOPICAL | Status: DC | PRN
Start: 2021-08-30 — End: 2021-08-30
  Administered 2021-08-30: 1000 mL

## 2021-08-30 MED ORDER — FENTANYL CITRATE (PF) 100 MCG/2ML IJ SOLN: 25.0000 ug | INTRAMUSCULAR | Status: DC | PRN

## 2021-08-30 MED ORDER — LIDOCAINE 2% (20 MG/ML) 5 ML SYRINGE
INTRAMUSCULAR | Status: AC
  Filled 2021-08-30: qty 5

## 2021-08-30 MED ORDER — MORPHINE SULFATE (PF) 2 MG/ML IV SOLN
2.0000 mg | INTRAVENOUS | Status: DC | PRN
  Administered 2021-08-30: 2 mg via INTRAVENOUS
  Filled 2021-08-30: qty 1

## 2021-08-30 MED ORDER — LACTATED RINGERS IV SOLN: INTRAVENOUS | Status: DC | PRN

## 2021-08-30 MED ORDER — ONDANSETRON HCL 4 MG/2ML IJ SOLN: 4.0000 mg | Freq: Once | INTRAMUSCULAR | Status: DC | PRN

## 2021-08-30 MED ORDER — FENTANYL CITRATE (PF) 100 MCG/2ML IJ SOLN
INTRAMUSCULAR | Status: AC
  Filled 2021-08-30: qty 2

## 2021-08-30 MED ORDER — ROCURONIUM BROMIDE 10 MG/ML (PF) SYRINGE
PREFILLED_SYRINGE | INTRAVENOUS | Status: DC | PRN
  Administered 2021-08-30: 70 mg via INTRAVENOUS

## 2021-08-30 MED ORDER — MIDAZOLAM HCL 2 MG/2ML IJ SOLN
INTRAMUSCULAR | Status: AC
  Filled 2021-08-30: qty 2

## 2021-08-30 MED ORDER — FENTANYL CITRATE (PF) 100 MCG/2ML IJ SOLN
25.0000 ug | INTRAMUSCULAR | Status: DC | PRN
  Administered 2021-08-30: 25 ug via INTRAVENOUS
  Administered 2021-08-30: 50 ug via INTRAVENOUS
  Administered 2021-08-30: 25 ug via INTRAVENOUS

## 2021-08-30 MED ORDER — PHENYLEPHRINE 40 MCG/ML (10ML) SYRINGE FOR IV PUSH (FOR BLOOD PRESSURE SUPPORT)
PREFILLED_SYRINGE | INTRAVENOUS | Status: AC
  Filled 2021-08-30: qty 10

## 2021-08-30 MED ORDER — DEXAMETHASONE SODIUM PHOSPHATE 10 MG/ML IJ SOLN
INTRAMUSCULAR | Status: DC | PRN
  Administered 2021-08-30: 10 mg via INTRAVENOUS

## 2021-08-30 MED ORDER — SUGAMMADEX SODIUM 200 MG/2ML IV SOLN
INTRAVENOUS | Status: DC | PRN
  Administered 2021-08-30: 200 mg via INTRAVENOUS

## 2021-08-30 MED ORDER — ROCURONIUM BROMIDE 10 MG/ML (PF) SYRINGE
PREFILLED_SYRINGE | INTRAVENOUS | Status: AC
  Filled 2021-08-30: qty 10

## 2021-08-30 MED ORDER — OXYCODONE HCL 5 MG/5ML PO SOLN
5.0000 mg | Freq: Once | ORAL | Status: AC | PRN
  Administered 2021-08-30: 5 mg via ORAL

## 2021-08-30 MED ORDER — MIDAZOLAM HCL 2 MG/2ML IJ SOLN
INTRAMUSCULAR | Status: DC | PRN
  Administered 2021-08-30: 2 mg via INTRAVENOUS

## 2021-08-30 MED ORDER — OXYCODONE HCL 5 MG PO TABS
5.0000 mg | ORAL_TABLET | ORAL | Status: DC | PRN
  Administered 2021-08-30: 10 mg via ORAL
  Filled 2021-08-30: qty 2

## 2021-08-30 MED ORDER — BUPIVACAINE-EPINEPHRINE 0.25% -1:200000 IJ SOLN
INTRAMUSCULAR | Status: DC | PRN
  Administered 2021-08-30: 12 mL

## 2021-08-30 MED ORDER — ONDANSETRON HCL 4 MG/2ML IJ SOLN
INTRAMUSCULAR | Status: AC
  Filled 2021-08-30: qty 2

## 2021-08-30 MED ORDER — LACTATED RINGERS IV SOLN: INTRAVENOUS | Status: DC

## 2021-08-30 MED ORDER — SODIUM CHLORIDE 0.9 % IR SOLN
Status: DC | PRN
Start: 2021-08-30 — End: 2021-08-30
  Administered 2021-08-30: 1000 mL

## 2021-08-30 MED ORDER — HEMOSTATIC AGENTS (NO CHARGE) OPTIME
TOPICAL | Status: DC | PRN
  Administered 2021-08-30: 1 via TOPICAL

## 2021-08-30 MED ORDER — DEXAMETHASONE SODIUM PHOSPHATE 10 MG/ML IJ SOLN
INTRAMUSCULAR | Status: AC
  Filled 2021-08-30: qty 1

## 2021-08-30 MED ORDER — DEXTROSE 5 % IV SOLN
INTRAVENOUS | Status: DC | PRN
  Administered 2021-08-30: 2 g via INTRAVENOUS

## 2021-08-30 MED ORDER — FENTANYL CITRATE (PF) 250 MCG/5ML IJ SOLN
INTRAMUSCULAR | Status: AC
  Filled 2021-08-30: qty 5

## 2021-08-30 MED ORDER — SODIUM CHLORIDE 0.9 % IV SOLN
INTRAVENOUS | Status: DC | PRN
  Administered 2021-08-30: 4 mL

## 2021-08-30 MED ORDER — AMISULPRIDE (ANTIEMETIC) 5 MG/2ML IV SOLN: 10.0000 mg | Freq: Once | INTRAVENOUS | Status: DC | PRN

## 2021-08-30 MED ORDER — ACETAMINOPHEN 325 MG PO TABS: 650.0000 mg | ORAL_TABLET | Freq: Four times a day (QID) | ORAL | Status: AC | PRN

## 2021-08-30 MED ORDER — ACETAMINOPHEN 500 MG PO TABS
1000.0000 mg | ORAL_TABLET | Freq: Four times a day (QID) | ORAL | Status: DC
  Administered 2021-08-30 – 2021-08-31 (×3): 1000 mg via ORAL
  Filled 2021-08-30 (×3): qty 2

## 2021-08-30 MED ORDER — SCOPOLAMINE 1 MG/3DAYS TD PT72
1.0000 | MEDICATED_PATCH | TRANSDERMAL | Status: DC
  Administered 2021-08-30: 1.5 mg via TRANSDERMAL
  Filled 2021-08-30: qty 1

## 2021-08-30 MED ORDER — OXYCODONE HCL 5 MG PO TABS: 5.0000 mg | ORAL_TABLET | Freq: Four times a day (QID) | ORAL | 0 refills | Status: AC | PRN

## 2021-08-30 MED ORDER — CHLORHEXIDINE GLUCONATE 0.12 % MT SOLN
15.0000 mL | OROMUCOSAL | Status: AC
  Administered 2021-08-30: 15 mL via OROMUCOSAL
  Filled 2021-08-30: qty 15

## 2021-08-30 MED ORDER — KETOROLAC TROMETHAMINE 15 MG/ML IJ SOLN
15.0000 mg | Freq: Four times a day (QID) | INTRAMUSCULAR | Status: DC
  Administered 2021-08-30 – 2021-08-31 (×3): 15 mg via INTRAVENOUS
  Filled 2021-08-30 (×3): qty 1

## 2021-08-30 MED ORDER — PROCHLORPERAZINE EDISYLATE 10 MG/2ML IJ SOLN
10.0000 mg | Freq: Four times a day (QID) | INTRAMUSCULAR | Status: DC | PRN
  Administered 2021-08-30: 10 mg via INTRAVENOUS
  Filled 2021-08-30: qty 2

## 2021-08-30 MED ORDER — FENTANYL CITRATE (PF) 250 MCG/5ML IJ SOLN
INTRAMUSCULAR | Status: DC | PRN
Start: 2021-08-30 — End: 2021-08-30
  Administered 2021-08-30: 100 ug via INTRAVENOUS
  Administered 2021-08-30: 50 ug via INTRAVENOUS
  Administered 2021-08-30 (×2): 25 ug via INTRAVENOUS

## 2021-08-30 MED ORDER — IBUPROFEN 600 MG PO TABS: 600.0000 mg | ORAL_TABLET | Freq: Four times a day (QID) | ORAL | Status: DC

## 2021-08-30 MED ORDER — ONDANSETRON HCL 4 MG/2ML IJ SOLN
INTRAMUSCULAR | Status: DC | PRN
  Administered 2021-08-30: 4 mg via INTRAVENOUS

## 2021-08-30 MED ORDER — OXYCODONE HCL 5 MG PO TABS: 5.0000 mg | ORAL_TABLET | Freq: Once | ORAL | Status: AC | PRN

## 2021-08-30 MED ORDER — PROPOFOL 10 MG/ML IV BOLUS
INTRAVENOUS | Status: DC | PRN
Start: 2021-08-30 — End: 2021-08-30
  Administered 2021-08-30: 160 mg via INTRAVENOUS

## 2021-08-30 MED ORDER — LIDOCAINE 2% (20 MG/ML) 5 ML SYRINGE
INTRAMUSCULAR | Status: DC | PRN
  Administered 2021-08-30: 60 mg via INTRAVENOUS

## 2021-08-30 MED ORDER — PROPOFOL 10 MG/ML IV BOLUS
INTRAVENOUS | Status: AC
  Filled 2021-08-30: qty 20

## 2021-08-30 MED ORDER — PHENYLEPHRINE 40 MCG/ML (10ML) SYRINGE FOR IV PUSH (FOR BLOOD PRESSURE SUPPORT)
PREFILLED_SYRINGE | INTRAVENOUS | Status: DC | PRN
  Administered 2021-08-30: 120 ug via INTRAVENOUS

## 2021-08-30 MED ORDER — BUPIVACAINE-EPINEPHRINE (PF) 0.25% -1:200000 IJ SOLN
INTRAMUSCULAR | Status: AC
  Filled 2021-08-30: qty 30

## 2021-08-30 MED ORDER — DOCUSATE SODIUM 100 MG PO CAPS: 100.0000 mg | ORAL_CAPSULE | Freq: Every day | ORAL | Status: AC | PRN

## 2021-08-30 MED ORDER — OXYCODONE HCL 5 MG/5ML PO SOLN
ORAL | Status: AC
  Filled 2021-08-30: qty 5

## 2021-08-30 SURGICAL SUPPLY — 44 items
APPLIER CLIP ROT 10 11.4 M/L (STAPLE) ×2
BENZOIN TINCTURE PRP APPL 2/3 (GAUZE/BANDAGES/DRESSINGS) ×2 IMPLANT
BLADE CLIPPER SURG (BLADE) IMPLANT
CANISTER SUCT 3000ML PPV (MISCELLANEOUS) ×2 IMPLANT
CHLORAPREP W/TINT 26 (MISCELLANEOUS) ×2 IMPLANT
CLIP APPLIE ROT 10 11.4 M/L (STAPLE) ×1 IMPLANT
CLSR STERI-STRIP ANTIMIC 1/2X4 (GAUZE/BANDAGES/DRESSINGS) ×1 IMPLANT
COVER MAYO STAND STRL (DRAPES) ×2 IMPLANT
COVER SURGICAL LIGHT HANDLE (MISCELLANEOUS) ×2 IMPLANT
DRAPE C-ARM 42X120 X-RAY (DRAPES) ×2 IMPLANT
DRSG TEGADERM 2-3/8X2-3/4 SM (GAUZE/BANDAGES/DRESSINGS) ×6 IMPLANT
DRSG TEGADERM 4X4.75 (GAUZE/BANDAGES/DRESSINGS) ×2 IMPLANT
ELECT REM PT RETURN 9FT ADLT (ELECTROSURGICAL) ×2
ELECTRODE REM PT RTRN 9FT ADLT (ELECTROSURGICAL) ×1 IMPLANT
GAUZE SPONGE 2X2 8PLY STRL LF (GAUZE/BANDAGES/DRESSINGS) ×1 IMPLANT
GLOVE SURG ENC MOIS LTX SZ7 (GLOVE) ×2 IMPLANT
GLOVE SURG UNDER POLY LF SZ7.5 (GLOVE) ×2 IMPLANT
GOWN STRL REUS W/ TWL LRG LVL3 (GOWN DISPOSABLE) ×3 IMPLANT
GOWN STRL REUS W/TWL LRG LVL3 (GOWN DISPOSABLE) ×3
KIT BASIN OR (CUSTOM PROCEDURE TRAY) ×2 IMPLANT
KIT TURNOVER KIT B (KITS) ×2 IMPLANT
NS IRRIG 1000ML POUR BTL (IV SOLUTION) ×2 IMPLANT
PAD ARMBOARD 7.5X6 YLW CONV (MISCELLANEOUS) ×2 IMPLANT
POUCH RETRIEVAL ECOSAC 10 (ENDOMECHANICALS) IMPLANT
POUCH RETRIEVAL ECOSAC 10MM (ENDOMECHANICALS) ×1
POUCH SPECIMEN RETRIEVAL 10MM (ENDOMECHANICALS) IMPLANT
SCISSORS LAP 5X35 DISP (ENDOMECHANICALS) ×2 IMPLANT
SET CHOLANGIOGRAPH 5 50 .035 (SET/KITS/TRAYS/PACK) ×2 IMPLANT
SET IRRIG TUBING LAPAROSCOPIC (IRRIGATION / IRRIGATOR) ×2 IMPLANT
SET TUBE SMOKE EVAC HIGH FLOW (TUBING) ×2 IMPLANT
SLEEVE ENDOPATH XCEL 5M (ENDOMECHANICALS) ×2 IMPLANT
SPECIMEN JAR SMALL (MISCELLANEOUS) ×2 IMPLANT
SPONGE GAUZE 2X2 8PLY STRL LF (GAUZE/BANDAGES/DRESSINGS) ×1 IMPLANT
SPONGE GAUZE 2X2 STER 10/PKG (GAUZE/BANDAGES/DRESSINGS) ×1
STRIP CLOSURE SKIN 1/2X4 (GAUZE/BANDAGES/DRESSINGS) ×1 IMPLANT
SUT MNCRL AB 4-0 PS2 18 (SUTURE) ×2 IMPLANT
SUT VICRYL 0 UR6 27IN ABS (SUTURE) ×1 IMPLANT
TOWEL GREEN STERILE (TOWEL DISPOSABLE) ×2 IMPLANT
TOWEL GREEN STERILE FF (TOWEL DISPOSABLE) ×2 IMPLANT
TRAY LAPAROSCOPIC MC (CUSTOM PROCEDURE TRAY) ×2 IMPLANT
TROCAR XCEL BLUNT TIP 100MML (ENDOMECHANICALS) ×2 IMPLANT
TROCAR XCEL NON-BLD 11X100MML (ENDOMECHANICALS) ×2 IMPLANT
TROCAR XCEL NON-BLD 5MMX100MML (ENDOMECHANICALS) ×2 IMPLANT
WATER STERILE IRR 1000ML POUR (IV SOLUTION) ×2 IMPLANT

## 2021-08-30 NOTE — Transfer of Care (Signed)
Immediate Anesthesia Transfer of Care Note ? ?Patient: Susan Nash ? ?Procedure(s) Performed: LAPAROSCOPIC CHOLECYSTECTOMY (Abdomen) ?INTRAOPERATIVE CHOLANGIOGRAM (Abdomen) ? ?Patient Location: PACU ? ?Anesthesia Type:General ? ?Level of Consciousness: awake, alert  and oriented ? ?Airway & Oxygen Therapy: Patient Spontanous Breathing ? ?Post-op Assessment: Report given to RN and Post -op Vital signs reviewed and stable ? ?Post vital signs: Reviewed and stable ? ?Last Vitals:  ?Vitals Value Taken Time  ?BP 127/84 08/30/21 0912  ?Temp    ?Pulse 64 08/30/21 0913  ?Resp 15 08/30/21 0913  ?SpO2 98 % 08/30/21 0913  ?Vitals shown include unvalidated device data. ? ?Last Pain:  ?Vitals:  ? 08/30/21 0716  ?TempSrc: Oral  ?PainSc:   ?   ? ?  ? ?Complications: No notable events documented. ?

## 2021-08-30 NOTE — Progress Notes (Signed)
Patient was pickup by transport at [phone] and report was given Leona Singleton.  ?

## 2021-08-30 NOTE — TOC Progression Note (Signed)
Transition of Care (TOC) - Progression Note  ? ? ?Patient Details  ?Name: Shahida Schnackenberg ?MRN: [id number] ?Date of Birth: 10-02-1995 ? ?Transition of Care (TOC) CM/SW Contact  ?Kingsley Plan, RN ?Phone Number: ?08/30/2021, 1:22 PM ? ?Clinical Narrative:    ? ?Changed pharmacy to [facility]. Added [facility] to AVS.  ? ? ?Transition of Care (TOC) Screening Note ? ? ?Patient Details  ?Name: Shakeela Rabadan ?Date of Birth: 1995/08/13 ? ? ? ?Transition of Care Department [facility]) has reviewed patient and no TOC needs have been identified at this time. We will continue to monitor patient advancement through interdisciplinary progression rounds. If new patient transition needs arise, please place a TOC consult. ?  ? ?  ?  ? ?Expected Discharge Plan and Services ?  ?  ?  ?  ?  ?Expected Discharge Date: 08/30/21               ?  ?  ?  ?  ?  ?  ?  ?  ?  ?  ? ? ?Social Determinants of Health (SDOH) Interventions ?  ? ?Readmission Risk Interventions ?No flowsheet data found. ? ?

## 2021-08-30 NOTE — Interval H&P Note (Signed)
History and Physical Interval Note: ? ?08/30/2021 ?6:57 AM ? ?Susan Nash  has presented today for surgery, with the diagnosis of Acute Cholecystitis.  The various methods of treatment have been discussed with the patient and family. After consideration of risks, benefits and other options for treatment, the patient has consented to  Procedure(s): ?LAPAROSCOPIC CHOLECYSTECTOMY WITH POSSIBLE INTRAOPERATIVE CHOLANGIOGRAM (N/A) as a surgical intervention.  The patient's history has been reviewed, patient examined, no change in status, stable for surgery.  I have reviewed the patient's chart and labs.  Questions were answered to the patient's satisfaction.   ? ? ?Wilmon Arms Beda Dula ? ? ?

## 2021-08-30 NOTE — Anesthesia Postprocedure Evaluation (Signed)
Anesthesia Post Note ? ?Patient: Susan Nash ? ?Procedure(s) Performed: LAPAROSCOPIC CHOLECYSTECTOMY (Abdomen) ?INTRAOPERATIVE CHOLANGIOGRAM (Abdomen) ? ?  ? ?Patient location during evaluation: PACU ?Anesthesia Type: General ?Level of consciousness: awake and alert ?Pain management: pain level controlled ?Vital Signs Assessment: post-procedure vital signs reviewed and stable ?Respiratory status: spontaneous breathing, nonlabored ventilation, respiratory function stable and patient connected to nasal cannula oxygen ?Cardiovascular status: blood pressure returned to baseline and stable ?Postop Assessment: no apparent nausea or vomiting ?Anesthetic complications: no ? ? ?No notable events documented. ? ?Last Vitals:  ?Vitals:  ? 08/30/21 1040 08/30/21 1047  ?BP: 126/82 130/81  ?Pulse:  64  ?Resp: 14 17  ?Temp: 36.6 ?C 36.6 ?C  ?SpO2: 99% 100%  ?  ?Last Pain:  ?Vitals:  ? 08/30/21 1047  ?TempSrc: Oral  ?PainSc:   ? ? ?  ?  ?  ?  ?  ?  ? ?Corydon Schweiss L Arial Galligan ? ? ? ? ?

## 2021-08-30 NOTE — Op Note (Signed)
Laparoscopic Cholecystectomy with IOC Procedure Note ? ?Indications: This patient presents with symptomatic gallbladder disease and will undergo laparoscopic cholecystectomy. ? ?Pre-operative Diagnosis: Calculus of gallbladder with other cholecystitis, without mention of obstruction ? ?Post-operative Diagnosis: Same ? ?Surgeon: Wynona Luna  ? ?Assistants:  Rikki Spearing, PA-S ? ?Anesthesia: General endotracheal anesthesia ? ?ASA Class: 2 ? ?Procedure Details  ?The patient was seen again in the Holding Room. The risks, benefits, complications, treatment options, and expected outcomes were discussed with the patient. The possibilities of reaction to medication, pulmonary aspiration, perforation of viscus, bleeding, recurrent infection, finding a normal gallbladder, the need for additional procedures, failure to diagnose a condition, the possible need to convert to an open procedure, and creating a complication requiring transfusion or operation were discussed with the patient. The likelihood of improving the patient's symptoms with return to their baseline status is good.  The patient and/or family concurred with the proposed plan, giving informed consent. The site of surgery properly noted. The patient was taken to Operating Room, identified as Susan Nash and the procedure verified as Laparoscopic Cholecystectomy with Intraoperative Cholangiogram. A Time Out was held and the above information confirmed. ? ?Prior to the induction of general anesthesia, antibiotic prophylaxis was administered. General endotracheal anesthesia was then administered and tolerated well. After the induction, the abdomen was prepped with Chloraprep and draped in the sterile fashion. The patient was positioned in the supine position. ? ?Local anesthetic agent was injected into the skin near the umbilicus and an incision made. We dissected down to the abdominal fascia with blunt dissection.  The fascia was incised vertically and we  entered the peritoneal cavity bluntly.  A pursestring suture of 0-Vicryl was placed around the fascial opening.  The Hasson cannula was inserted and secured with the stay suture.  Pneumoperitoneum was then created with CO2 and tolerated well without any adverse changes in the patient's vital signs. An 11-mm port was placed in the subxiphoid position.  Two 5-mm ports were placed in the right upper quadrant. All skin incisions were infiltrated with a local anesthetic agent before making the incision and placing the trocars.  ? ?We positioned the patient in reverse Trendelenburg, tilted slightly to the patient's left.  The gallbladder was identified, the fundus grasped and retracted cephalad. Adhesions were lysed bluntly and with the electrocautery where indicated, taking care not to injure any adjacent organs or viscus. The infundibulum was grasped and retracted laterally, exposing the peritoneum overlying the triangle of Calot. This was then divided and exposed in a blunt fashion. A critical view of the cystic duct and cystic artery was obtained.  The cystic duct was clearly identified and bluntly dissected circumferentially. The cystic duct was ligated with a clip distally.   An incision was made in the cystic duct and the [facility] cholangiogram catheter introduced. The catheter was secured using a clip. A cholangiogram was then obtained which showed good visualization of the distal and proximal biliary tree with no sign of filling defects or obstruction.  Contrast flowed easily into the duodenum. The catheter was then removed.  ? ?The cystic duct was then ligated with clips and divided. The cystic artery was identified, dissected free, ligated with clips and divided as well.  ? ?The gallbladder was dissected from the liver bed in retrograde fashion with the electrocautery. The gallbladder was removed and placed in an Eco sac. The liver bed was irrigated and inspected. Hemostasis was achieved with the electrocautery.  Copious irrigation was utilized and was repeatedly  aspirated until clear.  The gallbladder and Eco sac were then removed through the umbilical port site.  The pursestring suture was used to close the umbilical fascia.   ? ?We again inspected the right upper quadrant for hemostasis.  Pneumoperitoneum was released as we removed the trocars.  4-0 Monocryl was used to close the skin.   Benzoin, steri-strips, and clean dressings were applied. The patient was then extubated and brought to the recovery room in stable condition. Instrument, sponge, and needle counts were correct at closure and at the conclusion of the case.  ? ?Findings: ?Cholecystitis with Cholelithiasis ? ?Estimated Blood Loss: less than 50 mL ?        ?Drains: none ?        ?Specimens: Gallbladder     ?      ?Complications: None; patient tolerated the procedure well. ?        ?Disposition: PACU - hemodynamically stable. ?        ?Condition: stable ? ?Wilmon Arms. Tija Biss, MD, FACS ?[facility]  ?General Surgery ? ? ?08/30/2021 ?9:08 AM ? ? ? ?

## 2021-08-30 NOTE — Discharge Instructions (Addendum)
[facility] ? ?Please arrive at least 30 min before your appointment to complete your check in paperwork.  If you are unable to arrive 30 min prior to your appointment time we may have to cancel or reschedule you. ?LAPAROSCOPIC SURGERY: POST OP INSTRUCTIONS ?Always review your discharge instruction sheet given to you by the facility where your surgery was performed. ?IF YOU HAVE DISABILITY OR FAMILY LEAVE FORMS, YOU MUST BRING THEM TO THE OFFICE FOR PROCESSING.   ?DO NOT GIVE THEM TO YOUR DOCTOR. ? ?PAIN CONTROL ? ?First take acetaminophen (Tylenol) AND/or ibuprofen (Advil) to control your pain after surgery.  Follow directions on package.  Taking acetaminophen (Tylenol) and/or ibuprofen (Advil) regularly after surgery will help to control your pain and lower the amount of prescription pain medication you may need.  You should not take more than 4,000 mg (4 grams) of acetaminophen (Tylenol) in 24 hours.  You should not take ibuprofen (Advil), aleve, motrin, naprosyn or other NSAIDS if you have a history of stomach ulcers or chronic kidney disease.  ?A prescription for pain medication may be given to you upon discharge.  Take your pain medication as prescribed, if you still have uncontrolled pain after taking acetaminophen (Tylenol) or ibuprofen (Advil). ?Use ice packs to help control pain. ?If you need a refill on your pain medication, please contact your pharmacy.  They will contact our office to request authorization. Prescriptions will not be filled after 5pm or on week-ends. ? ?HOME MEDICATIONS ?Take your usually prescribed medications unless otherwise directed. ? ?DIET ?You should follow a light diet the first few days after arrival home.  Be sure to include lots of fluids daily. Avoid fatty, fried foods.  ? ?CONSTIPATION ?It is common to experience some constipation after surgery and if you are taking pain medication.  Increasing fluid intake and taking a stool softener (such as Colace)  will usually help or prevent this problem from occurring.  A mild laxative (Milk of Magnesia or Miralax) should be taken according to package instructions if there are no bowel movements after 48 hours. ? ?WOUND/INCISION CARE ?Most patients will experience some swelling and bruising in the area of the incisions.  Ice packs will help.  Swelling and bruising can take several days to resolve.  ?Unless discharge instructions indicate otherwise, follow guidelines below  ?STERI-STRIPS - you may remove your outer bandages 48 hours after surgery, and you may shower at that time.  You have steri-strips (small skin tapes) in place directly over the incision.  These strips should be left on the skin for 7-10 days.   ?DERMABOND/SKIN GLUE - you may shower in 24 hours.  The glue will flake off over the next 2-3 weeks. ?Any sutures or staples will be removed at the office during your follow-up visit. ? ?ACTIVITIES ?You may resume regular (light) daily activities beginning the next day--such as daily self-care, walking, climbing stairs--gradually increasing activities as tolerated.  You may have sexual intercourse when it is comfortable.  Refrain from any heavy lifting or straining until approved by your doctor. ?You may drive when you are no longer taking prescription pain medication, you can comfortably wear a seatbelt, and you can safely maneuver your car and apply brakes. ? ?FOLLOW-UP ?You should see your doctor in the office for a follow-up appointment approximately 2-3 weeks after your surgery.  You should have been given your post-op/follow-up appointment when your surgery was scheduled.  If you did not receive a post-op/follow-up appointment, make sure   that you call for this appointment within a day or two after you arrive home to insure a convenient appointment time. ? ?OTHER INSTRUCTIONS ? ?WHEN TO CALL YOUR DOCTOR: ?Fever over 101.0 ?Inability to urinate ?Continued bleeding from incision. ?Increased pain, redness, or  drainage from the incision. ?Increasing abdominal pain ? ?The clinic staff is available to answer your questions during regular business hours.  Please don?t hesitate to call and ask to speak to one of the nurses for clinical concerns.  If you have a medical emergency, go to the nearest emergency room or call 911.  A surgeon from [facility] is always on call at the hospital. ?[address], [address], [place], [place]  [address] ? [address] [address], [place], [place]   [address] ?[phone] ? [phone] ? FAX [phone] ? ? ? ? ?Managing Your Pain After Surgery Without Opioids ? ? ? ?Thank you for participating in our program to help patients manage their pain after surgery without opioids. This is part of our effort to provide you with the best care possible, without exposing you or your family to the risk that opioids pose. ? ?What pain can I expect after surgery? ?You can expect to have some pain after surgery. This is normal. The pain is typically worse the day after surgery, and quickly begins to get better. ?Many studies have found that many patients are able to manage their pain after surgery with Over-the-Counter (OTC) medications such as Tylenol and Motrin. If you have a condition that does not allow you to take Tylenol or Motrin, notify your surgical team. ? ?How will I manage my pain? ?The best strategy for controlling your pain after surgery is around the clock pain control with Tylenol (acetaminophen) and Motrin (ibuprofen or Advil). Alternating these medications with each other allows you to maximize your pain control. In addition to Tylenol and Motrin, you can use heating pads or ice packs on your incisions to help reduce your pain. ? ?How will I alternate your regular strength over-the-counter pain medication? ?You will take a dose of pain medication every three hours. ?Start by taking 650 mg of Tylenol (2 pills of 325 mg) ?3 hours later take 600 mg of Motrin (3 pills of 200  mg) ?3 hours after taking the Motrin take 650 mg of Tylenol ?3 hours after that take 600 mg of Motrin. ? ? ?- 1 - ? ?See example - if your first dose of Tylenol is at 12:00 PM ? ? ?12:00 PM Tylenol 650 mg (2 pills of 325 mg)  ?3:00 PM Motrin 600 mg (3 pills of 200 mg)  ?6:00 PM Tylenol 650 mg (2 pills of 325 mg)  ?9:00 PM Motrin 600 mg (3 pills of 200 mg)  ?Continue alternating every 3 hours  ? ?We recommend that you follow this schedule around-the-clock for at least 3 days after surgery, or until you feel that it is no longer needed. Use the table on the last page of this handout to keep track of the medications you are taking. ?Important: ?Do not take more than 3000mg  of Tylenol or 3200mg  of Motrin in a 24-hour period. ?Do not take ibuprofen/Motrin if you have a history of bleeding stomach ulcers, severe kidney disease, &/or actively taking a blood thinner ? ?What if I still have pain? ?If you have pain that is not controlled with the over-the-counter pain medications (Tylenol and Motrin or Advil) you might have what we call ?breakthrough? pain. You will receive  a prescription for a small amount of an opioid pain medication such as Oxycodone, Tramadol, or Tylenol with Codeine. Use these opioid pills in the first 24 hours after surgery if you have breakthrough pain. Do not take more than 1 pill every 4-6 hours. ? ?If you still have uncontrolled pain after using all opioid pills, don't hesitate to call our staff using the number provided. We will help make sure you are managing your pain in the best way possible, and if necessary, we can provide a prescription for additional pain medication. ? ? ?Day 1   ? ?Time  ?Name of Medication Number of pills taken  ?Amount of Acetaminophen  ?Pain Level  ? ?Comments  ?AM PM       ?AM PM       ?AM PM       ?AM PM       ?AM PM       ?AM PM       ?AM PM       ?AM PM       ?Total Daily amount of Acetaminophen ?Do not take more than  3,000 mg per day    ? ? ?Day 2   ? ?Time  ?Name  of Medication Number of pills ?taken  ?Amount of Acetaminophen  ?Pain Level  ? ?Comments  ?AM PM       ?AM PM       ?AM PM       ?AM PM       ?AM PM       ?AM PM       ?AM PM       ?AM PM       ?Total Daily

## 2021-08-30 NOTE — Discharge Summary (Signed)
? ? ?Patient ID: ?Susan Nash ?[id number] ?02/05/1996 26 y.o. ? ?Admit date: 08/29/2021 ?Discharge date: 08/31/2021 ? ?Admitting Diagnosis: ?cholecystitis ? ?Discharge Diagnosis ?Patient Active Problem List  ? Diagnosis Date Noted  ? Symptomatic cholelithiasis 08/29/2021  ? Bipolar 2 disorder ([place]) 04/19/2020  ? Anxiety 04/19/2020  ?S/p lap chole ? ?Consultants ?none ? ?Reason for Admission: ?Susan Nash is a 26yo female who [facility] to [facility] today complaining of worsening abdominal pain. She has a known h/o gallstones and reports symptoms dating back to 1-2 years ago. States that her symptoms have become more frequent and more severe. She reports eating [place] food last night then waking up at 0200 this morning with severe RUQ pain. Associated symptoms include nausea and vomiting. States that typically when she vomits her pain improves, but this time it did not so she came to the ED. ?In the ED her labwork was unremarkable with WBC 8.4, and LFTs and lipase WNL. U/s shows a 2.2 cm echogenic shadowing calculus in the neck of the gallbladder; positive Murphy's sign but no other findings suggestive of acute cholecystitis.  ?She was given pain medication with some benefit but symptoms did not fully resolve. General surgery asked to see. ? ?Procedures ?Lap chole with IOC, Dr. Georgette Dover 3/14 ? ?Hospital Course:  ?The patient was admitted and underwent a laparoscopic cholecystectomy with IOC.  The patient tolerated the procedure well.  On POD 1, the patient was tolerating a regular diet, voiding well, mobilizing, and pain was controlled with oral pain medications.  The patient was stable for DC home at this time with appropriate follow up made. ? ? ?Allergies as of 08/31/2021   ? ?   Reactions  ? Amoxicillin Hives  ? Gluten Meal Nausea Only  ? Lactose Intolerance (gi) Nausea Only  ? Cannot drink milk even with Lactaid  ? ?  ? ?  ?Medication List  ?  ? ?TAKE these medications   ? ?acetaminophen 325 MG tablet ?Commonly  known as: TYLENOL ?Take 2 tablets (650 mg total) by mouth every 6 (six) hours as needed for mild pain or moderate pain. ?  ?calcium carbonate 750 MG chewable tablet ?Commonly known as: TUMS EX ?Chew 1,500 mg by mouth daily as needed for heartburn. ?  ?docusate sodium 100 MG capsule ?Commonly known as: Colace ?Take 1 capsule (100 mg total) by mouth daily as needed for mild constipation. ?  ?hydrOXYzine 10 MG tablet ?Commonly known as: ATARAX ?Take 1 tablet (10 mg total) by mouth 3 (three) times daily as needed. ?  ?lamoTRIgine 100 MG tablet ?Commonly known as: LAMICTAL ?TAKE 1 TABLET DAILY FOR 2 WEEKS, THEN TAKE 2 TABLETS DAILY FOR 2 WEEKS, THEN 3 TABLETS DAILY ?  ?oxyCODONE 5 MG immediate release tablet ?Commonly known as: Oxy IR/ROXICODONE ?Take 1 tablet (5 mg total) by mouth every 6 (six) hours as needed for up to 5 days for moderate pain or severe pain. ?  ?venlafaxine XR 75 MG 24 hr capsule ?Commonly known as: EFFEXOR-XR ?TAKE 1 CAPSULE(75 MG) BY MOUTH DAILY WITH BREAKFAST ?  ?ZEGERID OTC PO ?Take 1 tablet by mouth daily as needed (heartburn). ?  ? ?  ? ? ? ? ? Follow-up Information   ? ? [facility] [place]. Call on 09/20/2021.   ?Specialty: General Surgery ?Why: 10:15am, arrive by 9:45am  ?Please arrive 30 minutes prior to your appointment to check in and fill out paperwork. Bring photo ID and insurance information. ?Contact information: ?[address] ?[address] ?  [place] ?[phone] ? ?  ?  ? ? [place]. Schedule an appointment as soon as possible for a visit.   ?Contact information: ?[address] ?[place] ?[phone] ? ?  ?  ? ?  ?  ? ?  ? ? ?Signed: ?Saverio Danker, PA-C ?[facility] ?08/31/2021, 8:49 AM ?Please see Amion for pager number during day hours 7:00am-4:30pm, 7-11:30am on Weekends ? ? ?

## 2021-08-30 NOTE — Anesthesia Procedure Notes (Signed)
Procedure Name: Intubation ?Date/Time: 08/30/2021 7:28 AM ?Performed by: Inda Coke, CRNA ?Pre-anesthesia Checklist: Patient identified, Emergency Drugs available, Suction available and Patient being monitored ?Patient Re-evaluated:Patient Re-evaluated prior to induction ?Oxygen Delivery Method: Circle System Utilized ?Preoxygenation: Pre-oxygenation with 100% oxygen ?Induction Type: IV induction ?Ventilation: Mask ventilation without difficulty ?Laryngoscope Size: Mac and 3 ?Grade View: Grade I ?Tube type: Oral ?Tube size: 7.0 mm ?Number of attempts: 1 ?Airway Equipment and Method: Stylet and Oral airway ?Placement Confirmation: ETT inserted through vocal cords under direct vision, positive ETCO2 and breath sounds checked- equal and bilateral ?Secured at: 22 cm ?Tube secured with: Tape ?Dental Injury: Teeth and Oropharynx as per pre-operative assessment  ? ? ? ? ?

## 2021-08-31 ENCOUNTER — Encounter (HOSPITAL_COMMUNITY): Payer: Self-pay | Admitting: Surgery

## 2021-08-31 LAB — SURGICAL PATHOLOGY

## 2021-08-31 NOTE — Addendum Note (Signed)
Addendum  created 08/31/21 1329 by Elmer Picker, MD  ? Intraprocedure Event deleted  ?  ?

## 2021-08-31 NOTE — Progress Notes (Signed)
AVS given and explained to patient. 

## 2021-08-31 NOTE — Progress Notes (Signed)
? ? ?1 Day Post-Op  ?Subjective: ?Patient is a 26 year old Caucasian female s/p day 1 cholecystectomy. Patient states she is doing much better than yesterday. Pain is well controlled. Patient states yesterday she had some nausea, but has not had any since and denies vomiting. Patient has been able to urinate but has had no BM. Patient has eaten a small amount but has not had a large appetite and has been able to drink.  ? ?ROS: See above, otherwise other systems negative ? ?Objective: ?Vital signs in last 24 hours: ?Temp:  [97.8 ?F (36.6 ?C)-98.5 ?F (36.9 ?C)] 97.9 ?F (36.6 ?C) (03/15 0435) ?Pulse Rate:  [49-80] 60 (03/15 0435) ?Resp:  [13-24] 18 (03/15 0435) ?BP: (98-136)/(55-85) 100/62 (03/15 0435) ?SpO2:  [95 %-100 %] 97 % (03/15 0030) ?Last BM Date : 08/29/21 ? ?Intake/Output from previous day: ?03/14 0701 - 03/15 0700 ?In: 1000 [I.V.:1000] ?Out: 5 [Blood:5] ?Intake/Output this shift: ?No intake/output data recorded. ? ?PE: ?General: Patient is a white female laying comfortably in bed. NAD.  ?Abdominal: soft, nondistended. Incision sites are covered, no erythema surround dressing.  ?Skin: Warm and dry.  ?Psych: Alert and oriented x 4. ? ?Lab Results:  ?Recent Labs  ?  08/29/21 ?0737 08/30/21 ?0358  ?WBC 8.4 7.0  ?HGB 13.7 12.8  ?HCT 40.4 37.0  ?PLT 330 241  ? ?BMET ?Recent Labs  ?  08/29/21 ?1062 08/30/21 ?0358  ?NA 141 139  ?K 4.1 3.6  ?CL 108 110  ?CO2 25 22  ?GLUCOSE 106* 90  ?BUN 13 5*  ?CREATININE 0.77 0.63  ?CALCIUM 8.9 8.6*  ? ?PT/INR ?No results for input(s): LABPROT, INR in the last 72 hours. ?CMP  ?   ?Component Value Date/Time  ? NA 139 08/30/2021 0358  ? K 3.6 08/30/2021 0358  ? CL 110 08/30/2021 0358  ? CO2 22 08/30/2021 0358  ? GLUCOSE 90 08/30/2021 0358  ? BUN 5 (L) 08/30/2021 0358  ? CREATININE 0.63 08/30/2021 0358  ? CALCIUM 8.6 (L) 08/30/2021 0358  ? PROT 5.7 (L) 08/30/2021 0358  ? ALBUMIN 3.2 (L) 08/30/2021 0358  ? AST 17 08/30/2021 0358  ? ALT 12 08/30/2021 0358  ? ALKPHOS 45 08/30/2021 0358   ? BILITOT 0.5 08/30/2021 0358  ? GFRNONAA >60 08/30/2021 0358  ? GFRAA >60 08/16/2016 2322  ? ?Lipase  ?   ?Component Value Date/Time  ? LIPASE 33 08/29/2021 0836  ? ? ? ? ? ?Studies/Results: ?DG Cholangiogram Operative ? ?Result Date: 08/30/2021 ?CLINICAL DATA:  Cholecystectomy for symptomatic cholelithiasis. EXAM: INTRAOPERATIVE CHOLANGIOGRAM TECHNIQUE: Cholangiographic images from the C-arm fluoroscopic device were submitted for interpretation post-operatively. Please see the procedural report for the amount of contrast and the fluoroscopy time utilized. FLUOROSCOPY: Radiation Exposure Index (as provided by the fluoroscopic device): 2.0 mGy Kerma COMPARISON:  Right upper quadrant abdominal ultrasound on 08/29/2021 FINDINGS: Intraoperative imaging with a C-arm demonstrates normal opacified bile ducts without evidence of biliary dilatation, stricture or filling defect. Contrast enters the duodenum normally. No contrast extravasation identified. IMPRESSION: Normal intraoperative cholangiogram. Electronically Signed   By: Irish Lack M.D.   On: 08/30/2021 08:34  ? ?US Abdomen Limited RUQ (LIVER/GB) ? ?Result Date: 08/29/2021 ?CLINICAL DATA:  Right upper quadrant pain EXAM: ULTRASOUND ABDOMEN LIMITED RIGHT UPPER QUADRANT COMPARISON:  None. FINDINGS: Gallbladder: 2.2 cm echogenic shadowing calculus in the neck of the gallbladder. No significant wall thickening or pericholecystic fluid. Positive sonographic Murphy's sign. Common bile duct: Diameter: 4 mm Liver: No focal lesion identified. Within normal  limits in parenchymal echogenicity. Portal vein is patent on color Doppler imaging with normal direction of blood flow towards the liver. Other: None. IMPRESSION: Cholelithiasis and positive sonographic Murphy's sign, suggestive of acute calculus cholecystitis. Electronically Signed   By: Jannifer Hick M.D.   On: 08/29/2021 09:36   ? ?Anti-infectives: ?Anti-infectives (From admission, onward)  ? ? Start      Dose/Rate Route Frequency Ordered Stop  ? 08/29/21 1230  cefTRIAXone (ROCEPHIN) 2 g in sodium chloride 0.9 % 100 mL IVPB       ? 2 g ?200 mL/hr over 30 Minutes Intravenous On call to O.R. 08/29/21 1117 08/30/21 0559  ? ?  ? ? ? ?Assessment/Plan ? S/P day 1 Cholecystectomy for symptomatic Cholecystitis with Cholelithiasis ?- Pain well managed. Stable vitals. Plan to discharge home today.  ? ?FEN - regular diet ?VTE - Lovenox ?ID - none ? ? LOS: 1 day  ? ? ?Criss Rosales , PA-S ?[facility] ?08/31/2021, 8:26 AM ? ? ?Agree with above note by PA-S, Rikki Spearing.  Patient doing well today and stable for DC home. ? ?Letha Cape, PA-C ?Please see Amion for pager number during day hours 7:00am-4:30pm or 7:00am -11:30am on weekends ? ?

## 2021-08-31 NOTE — Progress Notes (Signed)
Pt stated to her RN Irene Pap that she had some items that were missing and she has not found. Susan Nash, Irene Pap, RN and [place], DD all went in the room and searched no items found. Pt states that she is missing a Wallace Cullens " NAR" hoodie, along with a red and gold nose stud and a black industrial earring bar that has black and green studded balls. RN will call PACU and follow up with them prior to patient discharging. ?

## 2021-08-31 NOTE — Plan of Care (Signed)

## 2021-09-20 NOTE — Progress Notes (Signed)
   PROVIDER:  Tonja Aleck RIGGERS  MRN: [id number] DOB: 1995-06-26 DATE OF ENCOUNTER: 09/20/2021 History of Present Illness:   Susan Nash is a 26 y.o. female  who underwent emergent laparoscopic cholecystectomy with normal IOC on 08/30/21 by Dr. Belinda.  Discharged post-op day 0.  Denies abdominal pain and is not taking narcotic or OTC pain medication.  Tolerating diet well but complains of loose stool and constipation.  Denies issues with urination.  Denies fever, nausea, or vomiting.  She states she has not started her menstrual cycle yet, but she used to have irregular periods years ago.  Review of Systems:   A complete review of systems was obtained from the patient.  I have reviewed this information and discussed as appropriate with the patient.  See HPI as well for other ROS.  ROS All other review of systems negative.  Medications:   No current outpatient medications on file prior to visit.   No current facility-administered medications on file prior to visit.    Physical Examination:   BP 128/84   Pulse 85   Temp 37.1 C (98.7 F)   Ht 162.6 cm (5' 4)   Wt 82.6 kg (182 lb)   SpO2 98%   BMI 31.24 kg/m  General: Alert, oriented, in no acute distress Pulmonary: Effort normal Abdomen: Soft, non-tender, non-distended abdomen. Incisions clean, dry, and intact without surrounding erythema and drainage  Assessment and Plan:   Diagnoses and all orders for this visit:  Status post laparoscopic cholecystectomy    Patient is doing well and healing appropriately without signs of infection.  Pathology report reviewed with patient.  I explained that her menstrual cycle may be off due to having surgery, but she should continue to monitor and contact OB/GYN if needed.  I recommended taking a fiber supplement at half the dose for several weeks and then increasing to a full dose to help with her bowel movements.  We discussed gradually increasing activities as tolerated.    Follow-up  as needed.   Puja Gosai, PA-C

## 2022-08-18 ENCOUNTER — Other Ambulatory Visit: Payer: Self-pay

## 2022-08-18 ENCOUNTER — Emergency Department (HOSPITAL_COMMUNITY)
Admission: EM | Admit: 2022-08-18 | Discharge: 2022-08-18 | Disposition: A | Discharge status: Home / Self Care | Payer: Self-pay | Attending: Emergency Medicine | Admitting: Emergency Medicine

## 2022-08-18 ENCOUNTER — Encounter (HOSPITAL_COMMUNITY): Payer: Self-pay

## 2022-08-18 DIAGNOSIS — K1230 Oral mucositis (ulcerative), unspecified: Secondary | ICD-10-CM | POA: Insufficient documentation

## 2022-08-18 DIAGNOSIS — J029 Acute pharyngitis, unspecified: Secondary | ICD-10-CM | POA: Insufficient documentation

## 2022-08-18 DIAGNOSIS — Z1152 Encounter for screening for COVID-19: Secondary | ICD-10-CM | POA: Insufficient documentation

## 2022-08-18 LAB — RESP PANEL BY RT-PCR (RSV, FLU A&B, COVID)  RVPGX2
Influenza A by PCR: NEGATIVE
Influenza B by PCR: NEGATIVE
Resp Syncytial Virus by PCR: NEGATIVE
SARS Coronavirus 2 by RT PCR: NEGATIVE

## 2022-08-18 LAB — RAPID HIV SCREEN (HIV 1/2 AB+AG)
HIV 1/2 Antibodies: NONREACTIVE
HIV-1 P24 Antigen - HIV24: NONREACTIVE

## 2022-08-18 LAB — GROUP A STREP BY PCR: Group A Strep by PCR: NOT DETECTED

## 2022-08-18 MED ORDER — CLINDAMYCIN HCL 150 MG PO CAPS: 150.0000 mg | ORAL_CAPSULE | Freq: Four times a day (QID) | ORAL | 0 refills | Status: DC

## 2022-08-18 MED ORDER — LIDOCAINE VISCOUS HCL 2 % MT SOLN: 15.0000 mL | OROMUCOSAL | 0 refills | Status: AC | PRN

## 2022-08-18 NOTE — Discharge Instructions (Signed)
Please take antibiotic prescribed as treatment for pharyngitis.  Eat yogurt high in probiotic daily while on antibiotic to decrease risk of antibiotic related diarrhea.  You may use viscous lidocaine swish and swallow as needed for mouth pain.  An HIV test have been obtained and the results can be checked through MyChart, link below.

## 2022-08-18 NOTE — ED Provider Notes (Signed)
[facility] Provider Note   CSN: [id number] Arrival date & time: 08/18/22  1627     History  Chief Complaint  Patient presents with   Sore Throat    Susan Nash is a 27 y.o. female.  The history is provided by the patient and medical records. No language interpreter was used.  Sore Throat     27 year old female with significant history of anxiety, bipolar, presenting complaint of sore throat.  Patient report for the past several months she has had a lingering dental infection involving her right upper tooth.  That has since improved but for the past 2 weeks she has noted soreness in her throat and neck with enlarged lymph nodes.  Increased pain with palpation of the neck with swallowing.  She also noticed a lesion to the inner aspect of her mouth.  She is a smoker and states she habitually stuck her thumb on a daily basis.  She worries about infection.  She also mention concerns for potential HIV after she googled about enlarged lymph nodes.  She denies any vaginal bleeding or vaginal discharge.  She has been using Orajel at home for symptom control. She denies fever, chills, runny nose sneezing coughing chest pain or shortness of breath.  Home Medications Prior to Admission medications   Medication Sig Start Date End Date Taking? Authorizing Provider  acetaminophen (TYLENOL) 325 MG tablet Take 2 tablets (650 mg total) by mouth every 6 (six) hours as needed for mild pain or moderate pain. 08/30/21   Winferd Humphrey, PA-C  calcium carbonate (TUMS EX) 750 MG chewable tablet Chew 1,500 mg by mouth daily as needed for heartburn.    [provider]  docusate sodium (COLACE) 100 MG capsule Take 1 capsule (100 mg total) by mouth daily as needed for mild constipation. 08/30/21   Winferd Humphrey, PA-C  hydrOXYzine (ATARAX/VISTARIL) 10 MG tablet Take 1 tablet (10 mg total) by mouth 3 (three) times daily as needed. Patient not taking:  Reported on 08/29/2021 03/07/21   Salley Slaughter, NP  lamoTRIgine (LAMICTAL) 100 MG tablet TAKE 1 TABLET DAILY FOR 2 WEEKS, THEN TAKE 2 TABLETS DAILY FOR 2 WEEKS, THEN 3 TABLETS DAILY Patient not taking: Reported on 08/29/2021 08/01/21   Salley Slaughter, NP  Omeprazole-Sodium Bicarbonate (ZEGERID OTC PO) Take 1 tablet by mouth daily as needed (heartburn).    [provider]  venlafaxine XR (EFFEXOR-XR) 75 MG 24 hr capsule TAKE 1 CAPSULE(75 MG) BY MOUTH DAILY WITH BREAKFAST Patient not taking: Reported on 08/29/2021 07/07/21   Salley Slaughter, NP      Allergies    Amoxicillin, Gluten meal, and Lactose intolerance (gi)    Review of Systems   Review of Systems  All other systems reviewed and are negative.   Physical Exam Updated Vital Signs BP 125/83 (BP Location: Left Arm)   Pulse (!) 106   Temp 98.4 F (36.9 C) (Oral)   Resp 16   Ht '5\' 4"'$  (1.626 m)   Wt 77.1 kg   LMP  (LMP Unknown)   SpO2 100%   BMI 29.18 kg/m  Physical Exam Vitals and nursing note reviewed.  Constitutional:      General: She is not in acute distress.    Appearance: She is well-developed.  HENT:     Head: Atraumatic.     Right Ear: Tympanic membrane normal.     Left Ear: Tympanic membrane normal.     Nose:  No congestion.     Mouth/Throat:     Mouth: Mucous membranes are moist.     Comments: Less than 1 cm ulceration noted to right buccal mucosa adjacent to second molar.  Uvula midline no tonsillar enlargement or exudate no trismus no stridor.  Normal posterior oropharyngeal region. Eyes:     Conjunctiva/sclera: Conjunctivae normal.  Neck:     Comments: No thyromegaly Pulmonary:     Effort: Pulmonary effort is normal.  Musculoskeletal:     Cervical back: Neck supple.  Lymphadenopathy:     Cervical: Cervical adenopathy present.  Skin:    Findings: No rash.  Neurological:     Mental Status: She is alert.  Psychiatric:        Mood and Affect: Mood normal.     ED Results /  Procedures / Treatments   Labs (all labs ordered are listed, but only abnormal results are displayed) Labs Reviewed  RESP PANEL BY RT-PCR (RSV, FLU A&B, COVID)  RVPGX2  GROUP A STREP BY PCR  RAPID HIV SCREEN (HIV 1/2 AB+AG)    EKG None  Radiology No results found.  Procedures Procedures    Medications Ordered in ED Medications - No data to display  ED Course/ Medical Decision Making/ A&P                             Medical Decision Making  BP 125/83 (BP Location: Left Arm)   Pulse (!) 106   Temp 98.4 F (36.9 C) (Oral)   Resp 16   Ht '5\' 4"'$  (1.626 m)   Wt 77.1 kg   LMP  (LMP Unknown)   SpO2 100%   BMI 29.18 kg/m   56:48 PM  27 year old female with significant history of anxiety, bipolar, presenting complaint of sore throat.  Patient report for the past several months she has had a lingering dental infection involving her right upper tooth.  That has since improved but for the past 2 weeks she has noted soreness in her throat and neck with enlarged lymph nodes.  Increased pain with palpation of the neck with swallowing.  She also noticed a lesion to the inner aspect of her mouth.  She is a smoker and states she habitually stuck her thumb on a daily basis.  She worries about infection.  She also mention concerns for potential HIV after she googled about enlarged lymph nodes.  She denies any vaginal bleeding or vaginal discharge.  She has been using Orajel at home for symptom control. She denies fever, chills, runny nose sneezing coughing chest pain or shortness of breath.  On exam this is a well-appearing female appears to be in no acute discomfort.  Mouth exam remarkable for a shallow ulceration noted to her right buccal mucosa adjacent to the second molar.  Throat exam otherwise unremarkable no tonsillar enlargement or exudates no evidence of peritonsillar abscess or retropharyngeal abscess.  Neck exam remarkable for anterior cervical lymphadenopathy with mild tenderness.   Trachea is midline no thyromegaly.  Heart with mild tachycardia no murmur rubs or gallops, lungs clear to auscultation bilaterally abdomen soft nontender.  -Labs ordered, independently viewed and interpreted by me.  Labs remarkable for negative strep, covid, flu, RSV.  HIV test pending -The patient was maintained on a cardiac monitor.  I personally viewed and interpreted the cardiac monitored which showed an underlying rhythm of: sinus tachycardia -This patient presents to the ED for concern of sore throat, this  involves an extensive number of treatment options, and is a complaint that carries with it a high risk of complications and morbidity.  The differential diagnosis includes strep, mono, covid, flu, rsv, hiv, gonorrhea, chlamydia -Co morbidities that complicate the patient evaluation includes bipolar -Treatment includes supportive care -Reevaluation of the patient after these medicines showed that the patient improved -PCP office notes or outside notes reviewed -Escalation to admission/observation considered: patients feels much better, is comfortable with discharge, and will follow up with PCP -Prescription medication considered, patient comfortable with clindamycin and viscous lidocaine -Social Determinant of Health considered which includes tobacco use  Other patient test negative for strep, COVID flu and RSV, due to enlarged lymph nodes, recurrent throat discomfort for 2 weeks, will prescribe clindamycin to cover for potential underlying bacterial infection.  HIV test obtain which have not resulted and patient can follow-up the result through [facility].  Return precaution given.          Final Clinical Impression(s) / ED Diagnoses Final diagnoses:  Pharyngitis, unspecified etiology    Rx / DC Orders ED Discharge Orders          Ordered    clindamycin (CLEOCIN) 150 MG capsule  Every 6 hours        08/18/22 1832    lidocaine (XYLOCAINE) 2 % solution  As needed        08/18/22  1832              Domenic Moras, PA-C 08/18/22 1834    Valarie Merino, MD 08/18/22 [id number]

## 2022-08-18 NOTE — ED Triage Notes (Signed)
Pt reports sore throat x2-3 weeks.

## 2023-02-28 ENCOUNTER — Encounter (HOSPITAL_BASED_OUTPATIENT_CLINIC_OR_DEPARTMENT_OTHER): Payer: Self-pay | Admitting: Emergency Medicine

## 2023-02-28 ENCOUNTER — Emergency Department (HOSPITAL_BASED_OUTPATIENT_CLINIC_OR_DEPARTMENT_OTHER)
Admission: EM | Admit: 2023-02-28 | Discharge: 2023-03-01 | Disposition: A | Discharge status: Home / Self Care | Payer: No Typology Code available for payment source | Attending: Emergency Medicine | Admitting: Emergency Medicine

## 2023-02-28 ENCOUNTER — Emergency Department (HOSPITAL_BASED_OUTPATIENT_CLINIC_OR_DEPARTMENT_OTHER): Payer: No Typology Code available for payment source

## 2023-02-28 DIAGNOSIS — O209 Hemorrhage in early pregnancy, unspecified: Secondary | ICD-10-CM | POA: Insufficient documentation

## 2023-02-28 DIAGNOSIS — O4690 Antepartum hemorrhage, unspecified, unspecified trimester: Secondary | ICD-10-CM

## 2023-02-28 LAB — CBC
HCT: 37.2 % (ref 36.0–46.0)
Hemoglobin: 12.9 g/dL (ref 12.0–15.0)
MCH: 30.9 pg (ref 26.0–34.0)
MCHC: 34.7 g/dL (ref 30.0–36.0)
MCV: 89.2 fL (ref 80.0–100.0)
Platelets: 268 10*3/uL (ref 150–400)
RBC: 4.17 MIL/uL (ref 3.87–5.11)
RDW: 12.3 % (ref 11.5–15.5)
WBC: 9.7 10*3/uL (ref 4.0–10.5)
nRBC: 0 % (ref 0.0–0.2)

## 2023-02-28 LAB — ABO/RH: ABO/RH(D): O POS

## 2023-02-28 LAB — HCG, QUANTITATIVE, PREGNANCY: hCG, Beta Chain, Quant, S: 3542 m[IU]/mL — ABNORMAL HIGH (ref <5)

## 2023-02-28 NOTE — ED Provider Notes (Incomplete)
[place] EMERGENCY DEPARTMENT AT [facility] Provider Note   CSN: [id number] Arrival date & time: 02/28/23  2038     History {Add pertinent medical, surgical, social history, OB history to HPI:1} Chief Complaint  Patient presents with  . Vaginal Bleeding    + pregnancy    Susan Nash is a 27 y.o. female.  Patient with noncontributory past medical history presents today with complaints of abdominal pain and vaginal bleeding in pregnancy. She states that her last menstrual cycle was in mid June. She has not seen an OB/GYN yet. [facility] States that over the past few days she has had worsening bleeding with clots. Pain is right sided. This is her first pregnancy. Denies nausea, vomiting, diarrhea. No history of abdominal surgeries.  The history is provided by the patient. No language interpreter was used.  Vaginal Bleeding      Home Medications Prior to Admission medications   Medication Sig Start Date End Date Taking? Authorizing Provider  acetaminophen (TYLENOL) 325 MG tablet Take 2 tablets (650 mg total) by mouth every 6 (six) hours as needed for mild pain or moderate pain. 08/30/21   Eric Form, PA-C  calcium carbonate (TUMS EX) 750 MG chewable tablet Chew 1,500 mg by mouth daily as needed for heartburn.    [provider]  clindamycin (CLEOCIN) 150 MG capsule Take 1 capsule (150 mg total) by mouth every 6 (six) hours. 08/18/22   Fayrene Helper, PA-C  docusate sodium (COLACE) 100 MG capsule Take 1 capsule (100 mg total) by mouth daily as needed for mild constipation. 08/30/21   Eric Form, PA-C  hydrOXYzine (ATARAX/VISTARIL) 10 MG tablet Take 1 tablet (10 mg total) by mouth 3 (three) times daily as needed. Patient not taking: Reported on 08/29/2021 03/07/21   Shanna Cisco, NP  lamoTRIgine (LAMICTAL) 100 MG tablet TAKE 1 TABLET DAILY FOR 2 WEEKS, THEN TAKE 2 TABLETS DAILY FOR 2  WEEKS, THEN 3 TABLETS DAILY Patient not taking: Reported on 08/29/2021 08/01/21   Shanna Cisco, NP  lidocaine (XYLOCAINE) 2 % solution Use as directed 15 mLs in the mouth or throat as needed for mouth pain. 08/18/22   Fayrene Helper, PA-C  Omeprazole-Sodium Bicarbonate (ZEGERID OTC PO) Take 1 tablet by mouth daily as needed (heartburn).    [provider]  venlafaxine XR (EFFEXOR-XR) 75 MG 24 hr capsule TAKE 1 CAPSULE(75 MG) BY MOUTH DAILY WITH BREAKFAST Patient not taking: Reported on 08/29/2021 07/07/21   Shanna Cisco, NP      Allergies    Amoxicillin, Gluten meal, and Lactose intolerance (gi)    Review of Systems   Review of Systems  Genitourinary:  Positive for vaginal bleeding.  All other systems reviewed and are negative.   Physical Exam Updated Vital Signs BP (!) 103/56   Pulse 70   Temp 98.4 F (36.9 C)   Resp 20   LMP 01/03/2023 (Approximate)   SpO2 100%  Physical Exam Vitals and nursing note reviewed.  Constitutional:      General: She is not in acute distress.    Appearance: Normal appearance. She is normal weight. She is not ill-appearing, toxic-appearing or diaphoretic.  HENT:     Head: Normocephalic and atraumatic.  Cardiovascular:     Rate and Rhythm: Normal rate.  Pulmonary:     Effort: Pulmonary effort is normal. No respiratory distress.  Abdominal:     General: Abdomen is flat.  Palpations: Abdomen is soft.     Tenderness: There is no abdominal tenderness.  Musculoskeletal:        General: Normal range of motion.     Cervical back: Normal range of motion.  Skin:    General: Skin is warm and dry.  Neurological:     General: No focal deficit present.     Mental Status: She is alert.  Psychiatric:        Mood and Affect: Mood normal.        Behavior: Behavior normal.     ED Results / Procedures / Treatments   Labs (all labs ordered are listed, but only abnormal results are displayed) Labs Reviewed  HCG, QUANTITATIVE, PREGNANCY  - Abnormal; Notable for the following components:      Result Value   hCG, Beta Chain, Quant, S 3,542 (*)    All other components within normal limits  CBC  ABO/RH    EKG None  Radiology No results found.  Procedures Procedures  {Document cardiac monitor, telemetry assessment procedure when appropriate:1}  Medications Ordered in ED Medications - No data to display  ED Course/ Medical Decision Making/ A&P   {   Click here for ABCD2, HEART and other calculatorsREFRESH Note before signing :1}                              Medical Decision Making Amount and/or Complexity of Data Reviewed Labs: ordered. Radiology: ordered.   ***  {Document critical care time when appropriate:1} {Document review of labs and clinical decision tools ie heart score, Chads2Vasc2 etc:1}  {Document your independent review of radiology images, and any outside records:1} {Document your discussion with family members, caretakers, and with consultants:1} {Document social determinants of health affecting pt's care:1} {Document your decision making why or why not admission, treatments were needed:1} Final Clinical Impression(s) / ED Diagnoses Final diagnoses:  None    Rx / DC Orders ED Discharge Orders     None

## 2023-02-28 NOTE — ED Provider Notes (Signed)
[place] EMERGENCY DEPARTMENT AT [facility] Provider Note   CSN: [id number] Arrival date & time: 02/28/23  2038     History  Chief Complaint  Patient presents with   Vaginal Bleeding    + pregnancy    Susan Nash is a 27 y.o. female.  Patient with noncontributory past medical history presents today with complaints of abdominal pain and vaginal bleeding in pregnancy. She states that her last menstrual cycle was in mid July.  She missed her August menstrual cycle and subsequently took a pregnancy test that was positive.  She has not seen an OB/GYN yet. [facility] States that over the past few days she has had worsening bleeding with clots. Pain is right sided. This is her first pregnancy. Denies nausea, vomiting, diarrhea. No history of abdominal surgeries.  The history is provided by the patient. No language interpreter was used.  Vaginal Bleeding      Home Medications Prior to Admission medications   Medication Sig Start Date End Date Taking? Authorizing Provider  acetaminophen (TYLENOL) 325 MG tablet Take 2 tablets (650 mg total) by mouth every 6 (six) hours as needed for mild pain or moderate pain. 08/30/21   Eric Form, PA-C  calcium carbonate (TUMS EX) 750 MG chewable tablet Chew 1,500 mg by mouth daily as needed for heartburn.    [provider]  clindamycin (CLEOCIN) 150 MG capsule Take 1 capsule (150 mg total) by mouth every 6 (six) hours. 08/18/22   Fayrene Helper, PA-C  docusate sodium (COLACE) 100 MG capsule Take 1 capsule (100 mg total) by mouth daily as needed for mild constipation. 08/30/21   Eric Form, PA-C  hydrOXYzine (ATARAX/VISTARIL) 10 MG tablet Take 1 tablet (10 mg total) by mouth 3 (three) times daily as needed. Patient not taking: Reported on 08/29/2021 03/07/21   Shanna Cisco, NP  lamoTRIgine (LAMICTAL) 100 MG tablet TAKE 1 TABLET DAILY FOR 2 WEEKS, THEN TAKE  2 TABLETS DAILY FOR 2 WEEKS, THEN 3 TABLETS DAILY Patient not taking: Reported on 08/29/2021 08/01/21   Shanna Cisco, NP  lidocaine (XYLOCAINE) 2 % solution Use as directed 15 mLs in the mouth or throat as needed for mouth pain. 08/18/22   Fayrene Helper, PA-C  Omeprazole-Sodium Bicarbonate (ZEGERID OTC PO) Take 1 tablet by mouth daily as needed (heartburn).    [provider]  venlafaxine XR (EFFEXOR-XR) 75 MG 24 hr capsule TAKE 1 CAPSULE(75 MG) BY MOUTH DAILY WITH BREAKFAST Patient not taking: Reported on 08/29/2021 07/07/21   Shanna Cisco, NP      Allergies    Amoxicillin, Gluten meal, and Lactose intolerance (gi)    Review of Systems   Review of Systems  Genitourinary:  Positive for vaginal bleeding.  All other systems reviewed and are negative.   Physical Exam Updated Vital Signs BP (!) 103/56   Pulse 70   Temp 98.4 F (36.9 C)   Resp 20   LMP 01/03/2023 (Approximate)   SpO2 100%  Physical Exam Vitals and nursing note reviewed. Exam conducted with a chaperone present.  Constitutional:      General: She is not in acute distress.    Appearance: Normal appearance. She is normal weight. She is not ill-appearing, toxic-appearing or diaphoretic.  HENT:     Head: Normocephalic and atraumatic.  Cardiovascular:     Rate and Rhythm: Normal rate.  Pulmonary:     Effort: Pulmonary effort is normal.  No respiratory distress.  Abdominal:     General: Abdomen is flat.     Palpations: Abdomen is soft.     Tenderness: There is no abdominal tenderness.  Genitourinary:    Comments: Trace blood present in the vagina. No POC Cervix is closed. No CMT or vaginal discharge.  Musculoskeletal:        General: Normal range of motion.     Cervical back: Normal range of motion.  Skin:    General: Skin is warm and dry.  Neurological:     General: No focal deficit present.     Mental Status: She is alert.  Psychiatric:        Mood and Affect: Mood normal.        Behavior:  Behavior normal.     ED Results / Procedures / Treatments   Labs (all labs ordered are listed, but only abnormal results are displayed) Labs Reviewed  HCG, QUANTITATIVE, PREGNANCY - Abnormal; Notable for the following components:      Result Value   hCG, Beta Chain, Quant, S 3,542 (*)    All other components within normal limits  CBC  ABO/RH    EKG None  Radiology US OB LESS THAN 14 WEEKS WITH OB TRANSVAGINAL  Result Date: 03/01/2023 CLINICAL DATA:  Vaginal bleeding. EXAM: OBSTETRIC <14 WK [place] AND TRANSVAGINAL OB US TECHNIQUE: Both transabdominal and transvaginal ultrasound examinations were performed for complete evaluation of the gestation as well as the maternal uterus, adnexal regions, and pelvic cul-de-sac. Transvaginal technique was performed to assess early pregnancy. COMPARISON:  None Available. FINDINGS: Intrauterine gestational sac: None Yolk sac:  Not Visualized. Embryo:  Not Visualized. Cardiac Activity: Not Visualized. Heart Rate: N/A  bpm Maternal uterus/adnexae: The uterus measures 10.2 cm x 4.7 cm x 6.6 cm (volume 166.82 mL). The endometrium measures 11.4 mm in thickness. The right ovary measures 2.4 cm x 1.6 cm x 1.3 cm and is normal in appearance. The left ovary is poorly visualized secondary to overlying bowel gas. No pelvic free fluid is seen. IMPRESSION: 1. No evidence of an intrauterine pregnancy. Correlation with follow-up pelvic ultrasound and serial beta HCG levels is recommended if this is of clinical concern. Electronically Signed   By: Aram Candela M.D.   On: 03/01/2023 00:06    Procedures Procedures    Medications Ordered in ED Medications - No data to display  ED Course/ Medical Decision Making/ A&P                                 Medical Decision Making Amount and/or Complexity of Data Reviewed Labs: ordered. Radiology: ordered.   This patient is a 27 y.o. female who presents to the ED for concern of vaginal bleeding abdominal cramping in  pregnancy, this involves an extensive number of treatment options, and is a complaint that carries with it a high risk of complications and morbidity. The emergent differential diagnosis prior to evaluation includes, but is not limited to,  Abnormal uterine bleeding, threatened miscarriage, incomplete miscarriage, normal bleeding from an early trimester pregnancy, ectopic pregnancy, vaginal/cervical trauma, subchorionic hemorrhage/hematoma    This is not an exhaustive differential.   Past Medical History / Co-morbidities / Social History: G1P0  Additional history: Chart reviewed.  Physical Exam: Physical exam performed. The pertinent findings include: Trace blood present in the vagina, cervix closed, no CMT or discharge  Lab Tests: I ordered, and personally interpreted labs.  The  pertinent results include:  hcg 3,542, no other acute laboratory abnormalities   Imaging Studies: I ordered imaging studies including pelvic US. I independently visualized and interpreted imaging which showed   1. No evidence of an intrauterine pregnancy. Correlation with follow-up pelvic ultrasound and serial beta HCG levels is recommended if this is of clinical concern.  I agree with the radiologist interpretation.   Disposition: After consideration of the diagnostic results and the patients response to treatment, I feel that emergency department workup does not suggest an emergent condition requiring admission or immediate intervention beyond what has been performed at this time. The plan is: Discharge with plans for serial beta-hCG testing within 1 week to ensure patient no longer has a viable pregnancy. Evaluation and diagnostic testing in the emergency department does not suggest an emergent condition requiring admission or immediate intervention beyond what has been performed at this time.  Plan for discharge with close PCP follow-up.  Patient is understanding and amenable with plan, educated on red flag  symptoms that would prompt immediate return.  Patient discharged in stable condition.  Final Clinical Impression(s) / ED Diagnoses Final diagnoses:  Vaginal bleeding in pregnancy    Rx / DC Orders ED Discharge Orders     None     An After Visit Summary was printed and given to the patient.     Vear Clock 03/01/23 0025    Tegeler, Canary Brim, MD 03/01/23 [phone]

## 2023-02-28 NOTE — ED Notes (Signed)
[facility]

## 2023-02-28 NOTE — ED Triage Notes (Signed)
+   pregnancy test, Last menstral July Started spotting 2 weeks ago, today  bleeding with clots  down legs and needing pads. Has needed 2 pad since 7pm  Some cramping

## 2023-03-01 NOTE — Discharge Instructions (Addendum)
As we discussed, your ultrasound did not show any signs of a viable pregnancy. Your hcg was 3,542. You need to have this repeated in 1 week to ensure that you in fact have had a miscarriage. I recommend following up with an OB/GYN to have this done. They can advise you on how to proceed from there.   Return if development of any new or worsening symptoms.

## 2024-01-10 ENCOUNTER — Ambulatory Visit
Admission: EM | Admit: 2024-01-10 | Discharge: 2024-01-10 | Disposition: A | Discharge status: Home / Self Care | Attending: Internal Medicine | Admitting: Internal Medicine

## 2024-01-10 ENCOUNTER — Encounter: Payer: Self-pay | Admitting: Emergency Medicine

## 2024-01-10 ENCOUNTER — Telehealth: Payer: Self-pay | Admitting: Internal Medicine

## 2024-01-10 DIAGNOSIS — K029 Dental caries, unspecified: Secondary | ICD-10-CM | POA: Diagnosis present

## 2024-01-10 DIAGNOSIS — K047 Periapical abscess without sinus: Secondary | ICD-10-CM | POA: Diagnosis present

## 2024-01-10 DIAGNOSIS — Z3202 Encounter for pregnancy test, result negative: Secondary | ICD-10-CM | POA: Insufficient documentation

## 2024-01-10 DIAGNOSIS — Z9189 Other specified personal risk factors, not elsewhere classified: Secondary | ICD-10-CM | POA: Diagnosis present

## 2024-01-10 LAB — POCT URINE PREGNANCY: Preg Test, Ur: NEGATIVE

## 2024-01-10 MED ORDER — CLINDAMYCIN HCL 300 MG PO CAPS
300.0000 mg | ORAL_CAPSULE | Freq: Three times a day (TID) | ORAL | 0 refills | Status: AC
Start: 2024-01-10 — End: 2024-01-17

## 2024-01-10 NOTE — Telephone Encounter (Addendum)
 Patient attempted to pick up clindamycin  from pharmacy and asked if medication interacts with her birth control at pharmacy. Pharmacist stated yes, the clinda does interact with birth control.  She takes the O pill OTC from [place].  I called and spoke with patient after confirming her identity with 2 patient identifiers to discuss use of clindamycin  to treat for dental infection despite potential decreased efficacy of her birth control.  She is agreeable with using clindamycin  and states she will use other forms of protection for the next 7-10 days while taking antibiotic.  Offered flagyl 500mg  and cefdinir 300mg  BID for 7 days to treat infection alternatively per UTD guidelines given her allergy to amoxicillin .  Shared decision making used to decide on continuation of clindamycin  treatment with extra precaution regarding contraception.   I spoke with pharmacy after confirming with patient and medication is ready to be filled.

## 2024-01-10 NOTE — Discharge Instructions (Signed)
 STD testing pending, this will take 2-3 days to result. We will only call you if your testing is positive for any infection(s) and we will provide treatment.  Avoid sexual intercourse until your STD results come back.  If any of your STD results are positive, you will need to avoid sexual intercourse for 7 days while you are being treated to prevent spread of STD.  Condom use is the best way to prevent spread of STDs. Notify partner(s) of any positive results.  Return to urgent care as needed.   Your dental pain is likely due to dental infection. Take  antibiotic as prescribed for the next 7 days to treat your dental infection. Continue use of ibuprofen  as needed with food for dental inflammation and pain.   You may also use tylenol  as needed for pain. Perform salt water gargles every 3-4 hours.  Schedule an appointment with one of the dentists on the list provided to urgent care today.  If you develop any new or worsening symptoms or if your symptoms do not start to improve, pleases return here or follow-up with your primary care provider. If your symptoms are severe, please go to the emergency room.

## 2024-01-10 NOTE — ED Triage Notes (Addendum)
 Pt c/o right upper tooth pain for 2 days. States she has had cavity there for over a year and just has not gotten it fixed yet.   She would also like STD testing if her insurance goes through

## 2024-01-10 NOTE — ED Provider Notes (Signed)
 [facility]    CSN: [id number] Arrival date & time: 01/10/24  1421      History   Chief Complaint Chief Complaint  Patient presents with   Dental Pain    HPI Susan Nash is a 28 y.o. female.   Susan Nash is a 28 y.o. female presenting for chief complaint of Dental Pain that started 1-2 days ago. History of known cavities to the right upper mouth, states they become painful and infected every few months. She does not have dental insurance and does not receive routine dental care. Denies drainage from the upper right mouth, fever, chills, nausea, vomiting, neck pain, facial swelling, decreased ROM of the jaw/pain with jaw movement, and recent trauma/injuries to the mouth.   She is additionally requesting STD testing. Recently sexually active with new female partner unprotected, denies known exposures to STDs. She is currently asymptomatic and denies concerning vaginal discharge, vaginal odor, and vaginal itching. Denies urinary symptoms, abdominal pain, and chance of pregnancy. She takes the O pill from [facility] for birth control without missed doses.    Dental Pain   Past Medical History:  Diagnosis Date   Anxiety    Bipolar 2 disorder [facility])    Depression     Patient Active Problem List   Diagnosis Date Noted   Symptomatic cholelithiasis 08/29/2021   Bipolar 2 disorder (HCC) 04/19/2020   Anxiety 04/19/2020    Past Surgical History:  Procedure Laterality Date   ANKLE ARTHROSCOPY WITH RECONSTRUCTION     CHOLECYSTECTOMY N/A 08/30/2021   Procedure: LAPAROSCOPIC CHOLECYSTECTOMY;  Surgeon: Belinda Cough, MD;  Location: [facility] OR;  Service: General;  Laterality: N/A;   INTRAOPERATIVE CHOLANGIOGRAM N/A 08/30/2021   Procedure: INTRAOPERATIVE CHOLANGIOGRAM;  Surgeon: Belinda Cough, MD;  Location: [facility] OR;  Service: General;  Laterality: N/A;    OB History     Gravida  1   Para      Term      Preterm      AB      Living         SAB      IAB       Ectopic      Multiple      Live Births               Home Medications    Prior to Admission medications   Medication Sig Start Date End Date Taking? Authorizing Provider  clindamycin  (CLEOCIN ) 300 MG capsule Take 1 capsule (300 mg total) by mouth 3 (three) times daily for 7 days. 01/10/24 01/17/24 Yes StanhopeDorna HERO, FNP  acetaminophen  (TYLENOL ) 325 MG tablet Take 2 tablets (650 mg total) by mouth every 6 (six) hours as needed for mild pain or moderate pain. 08/30/21   Rosalba Glendale DEL, PA-C  calcium carbonate (TUMS EX) 750 MG chewable tablet Chew 1,500 mg by mouth daily as needed for heartburn.    [provider]  docusate sodium  (COLACE) 100 MG capsule Take 1 capsule (100 mg total) by mouth daily as needed for mild constipation. 08/30/21   Rosalba Glendale DEL, PA-C  hydrOXYzine  (ATARAX /VISTARIL ) 10 MG tablet Take 1 tablet (10 mg total) by mouth 3 (three) times daily as needed. Patient not taking: Reported on 08/29/2021 03/07/21   Parsons, Brittney E, NP  lamoTRIgine  (LAMICTAL ) 100 MG tablet TAKE 1 TABLET DAILY FOR 2 WEEKS, THEN TAKE 2 TABLETS DAILY FOR 2 WEEKS, THEN 3 TABLETS DAILY Patient not taking: Reported on 08/29/2021 08/01/21   Harl Regan  E, NP  lidocaine  (XYLOCAINE ) 2 % solution Use as directed 15 mLs in the mouth or throat as needed for mouth pain. 08/18/22   Nivia Colon, PA-C  Omeprazole-Sodium Bicarbonate (ZEGERID OTC PO) Take 1 tablet by mouth daily as needed (heartburn).    [provider]  venlafaxine  XR (EFFEXOR -XR) 75 MG 24 hr capsule TAKE 1 CAPSULE(75 MG) BY MOUTH DAILY WITH BREAKFAST Patient not taking: Reported on 08/29/2021 07/07/21   Harl Zane BRAVO, NP    Family History History reviewed. No pertinent family history.  Social History Social History   Tobacco Use   Smoking status: Light Smoker   Smokeless tobacco: Never  Substance Use Topics   Alcohol use: No    Comment: rare     Allergies   Amoxicillin , Gluten meal, and  Lactose intolerance (gi)   Review of Systems Review of Systems Per HPI  Physical Exam Triage Vital Signs ED Triage Vitals  Encounter Vitals Group     BP      Girls Systolic BP Percentile      Girls Diastolic BP Percentile      Boys Systolic BP Percentile      Boys Diastolic BP Percentile      Pulse      Resp      Temp      Temp src      SpO2      Weight      Height      Head Circumference      Peak Flow      Pain Score      Pain Loc      Pain Education      Exclude from Growth Chart    No data found.  Updated Vital Signs BP 103/69 (BP Location: Right Arm)   Pulse 99   Temp 98.5 F (36.9 C) (Oral)   Resp 20   LMP 12/12/2023 (Approximate)   SpO2 96%   Visual Acuity Right Eye Distance:   Left Eye Distance:   Bilateral Distance:    Right Eye Near:   Left Eye Near:    Bilateral Near:     Physical Exam Vitals and nursing note reviewed.  Constitutional:      Appearance: She is not ill-appearing or toxic-appearing.  HENT:     Head: Normocephalic and atraumatic.     Right Ear: Hearing and external ear normal.     Left Ear: Hearing and external ear normal.     Nose: Nose normal.     Mouth/Throat:     Lips: Pink.     Mouth: Mucous membranes are moist. No injury or oral lesions.     Dentition: Abnormal dentition. Does not have dentures. Dental tenderness and dental caries present. No gingival swelling, dental abscesses or gum lesions.     Tongue: No lesions.     Pharynx: Oropharynx is clear. Uvula midline. No pharyngeal swelling, oropharyngeal exudate, posterior oropharyngeal erythema, uvula swelling or postnasal drip.     Tonsils: No tonsillar exudate.      Comments: No trismus, phonation normal, maintaining secretions without difficulty.  Eyes:     General: Lids are normal. Vision grossly intact. Gaze aligned appropriately.     Extraocular Movements: Extraocular movements intact.     Conjunctiva/sclera: Conjunctivae normal.  Neck:     Trachea: Trachea  and phonation normal.  Pulmonary:     Effort: Pulmonary effort is normal.  Musculoskeletal:     Cervical back: Neck supple.  Lymphadenopathy:  Cervical: Cervical adenopathy present.  Skin:    General: Skin is warm and dry.     Capillary Refill: Capillary refill takes less than 2 seconds.     Findings: No rash.  Neurological:     General: No focal deficit present.     Mental Status: She is alert and oriented to person, place, and time. Mental status is at baseline.     Cranial Nerves: No dysarthria or facial asymmetry.  Psychiatric:        Mood and Affect: Mood normal.        Speech: Speech normal.        Behavior: Behavior normal.        Thought Content: Thought content normal.        Judgment: Judgment normal.      UC Treatments / Results  Labs (all labs ordered are listed, but only abnormal results are displayed) Labs Reviewed  RPR  HIV ANTIBODY (ROUTINE TESTING W REFLEX)  POCT URINE PREGNANCY  CERVICOVAGINAL ANCILLARY ONLY    EKG   Radiology No results found.  Procedures Procedures (including critical care time)  Medications Ordered in UC Medications - No data to display  Initial Impression / Assessment and Plan / UC Course  I have reviewed the triage vital signs and the nursing notes.  Pertinent labs & imaging results that were available during my care of the patient were reviewed by me and considered in my medical decision making (see chart for details).   1. Dental infection, infected dental caries Evaluation suggests dental pain secondary to dental infection.  HEENT exam stable and without red flag signs indicating need for advanced imaging/further emergent workup. Patient is afebrile, nontoxic in appearance, and with hemodynamically stable vital signs.  Antibiotic (clindamycin ) ordered. She is allergic to penicillins. Recommend supportive care for symptomatic relief as outlined in AVS.  Information for low cost community dental resources provided.   Encouraged to follow-up with dentist for further management.   2. At risk for STD due to unprotected sex, negative pregnancy test STI labs pending, will notify patient of positive results and treat accordingly per protocol when labs result.  Patient would like HIV and syphilis testing today.   Patient to avoid sexual intercourse until screening testing comes back.   Education provided regarding safe sexual practices and patient encouraged to use protection to prevent spread of STIs.   Urine pregnancy test negative.  Counseled patient on potential for adverse effects with medications prescribed/recommended today, strict ER and return-to-clinic precautions discussed, patient verbalized understanding.     Final Clinical Impressions(s) / UC Diagnoses   Final diagnoses:  At risk for sexually transmitted disease due to unprotected sex  Dental infection  Infected dental caries  Negative pregnancy test     Discharge Instructions      STD testing pending, this will take 2-3 days to result. We will only call you if your testing is positive for any infection(s) and we will provide treatment.  Avoid sexual intercourse until your STD results come back.  If any of your STD results are positive, you will need to avoid sexual intercourse for 7 days while you are being treated to prevent spread of STD.  Condom use is the best way to prevent spread of STDs. Notify partner(s) of any positive results.  Return to urgent care as needed.   Your dental pain is likely due to dental infection. Take  antibiotic as prescribed for the next 7 days to treat your dental infection.  Continue use of ibuprofen  as needed with food for dental inflammation and pain.   You may also use tylenol  as needed for pain. Perform salt water gargles every 3-4 hours.  Schedule an appointment with one of the dentists on the list provided to urgent care today.  If you develop any new or worsening symptoms or if your symptoms  do not start to improve, pleases return here or follow-up with your primary care provider. If your symptoms are severe, please go to the emergency room.     ED Prescriptions     Medication Sig Dispense Auth. Provider   clindamycin  (CLEOCIN ) 300 MG capsule Take 1 capsule (300 mg total) by mouth 3 (three) times daily for 7 days. 21 capsule Enedelia Dorna HERO, FNP      PDMP not reviewed this encounter.   Enedelia Dorna HERO, [place] 01/10/24 2221

## 2024-01-11 LAB — CERVICOVAGINAL ANCILLARY ONLY
Chlamydia: NEGATIVE
Comment: NEGATIVE
Comment: NEGATIVE
Comment: NORMAL
Neisseria Gonorrhea: NEGATIVE
Trichomonas: NEGATIVE

## 2024-01-11 LAB — RPR: RPR Ser Ql: NONREACTIVE

## 2024-01-11 LAB — HIV ANTIBODY (ROUTINE TESTING W REFLEX): HIV Screen 4th Generation wRfx: NONREACTIVE
